# Patient Record
Sex: Female | Born: 2000 | Race: White | Hispanic: No | Smoking: Former smoker
Health system: Southern US, Community
[De-identification: ages and names within clinical notes are randomized; demographics above are authoritative.]

## PROBLEM LIST (undated history)

## (undated) ENCOUNTER — Inpatient Hospital Stay: Payer: Self-pay

## (undated) ENCOUNTER — Inpatient Hospital Stay (HOSPITAL_COMMUNITY): Payer: Self-pay

## (undated) DIAGNOSIS — N8000 Endometriosis of the uterus, unspecified: Secondary | ICD-10-CM

## (undated) DIAGNOSIS — E669 Obesity, unspecified: Secondary | ICD-10-CM

## (undated) DIAGNOSIS — D649 Anemia, unspecified: Secondary | ICD-10-CM

## (undated) DIAGNOSIS — R0600 Dyspnea, unspecified: Secondary | ICD-10-CM

## (undated) DIAGNOSIS — G43909 Migraine, unspecified, not intractable, without status migrainosus: Secondary | ICD-10-CM

## (undated) DIAGNOSIS — M79661 Pain in right lower leg: Secondary | ICD-10-CM

## (undated) DIAGNOSIS — N939 Abnormal uterine and vaginal bleeding, unspecified: Secondary | ICD-10-CM

## (undated) DIAGNOSIS — N83209 Unspecified ovarian cyst, unspecified side: Secondary | ICD-10-CM

## (undated) DIAGNOSIS — R519 Headache, unspecified: Secondary | ICD-10-CM

## (undated) DIAGNOSIS — Z8659 Personal history of other mental and behavioral disorders: Secondary | ICD-10-CM

## (undated) DIAGNOSIS — R51 Headache: Secondary | ICD-10-CM

## (undated) DIAGNOSIS — F32A Depression, unspecified: Secondary | ICD-10-CM

## (undated) DIAGNOSIS — Z87898 Personal history of other specified conditions: Secondary | ICD-10-CM

## (undated) HISTORY — PX: TONSILLECTOMY: SUR1361

## (undated) HISTORY — DX: Pain in right lower leg: M79.661

## (undated) HISTORY — DX: Obesity, unspecified: E66.9

## (undated) HISTORY — DX: Abnormal uterine and vaginal bleeding, unspecified: N93.9

## (undated) HISTORY — DX: Depression, unspecified: F32.A

## (undated) HISTORY — DX: Anemia, unspecified: D64.9

## (undated) HISTORY — DX: Personal history of other specified conditions: Z87.898

## (undated) HISTORY — DX: Personal history of other mental and behavioral disorders: Z86.59

## (undated) HISTORY — PX: WISDOM TOOTH EXTRACTION: SHX21

## (undated) HISTORY — DX: Dyspnea, unspecified: R06.00

## (undated) HISTORY — DX: Migraine, unspecified, not intractable, without status migrainosus: G43.909

---
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
## 2018-01-25 IMAGING — US US MFM OB DETAIL+14 WK
1 series · 12 of 28 positions shown · non-contrast
Comparison: none

PATIENT INFO:

PERFORMED BY:
ADURSH
SERVICE(S) PROVIDED:
INDICATIONS:
19 weeks gestation of pregnancy
Substance abuse
FETAL EVALUATION:
Num Of Fetuses:     1
Fetal Heart         135
Rate(bpm):
Cardiac Activity:   Present
Presentation:       Vertex
Placenta:           Anterior
P. Cord Insertion:  Normal
Largest Pocket(cm)
3.9
BIOMETRY:
BPD:      45.5  mm     G. Age:  19w 5d         81  %    CI:         75.3   %    70 - 86
FL/HC:      20.1   %    16.1 -
HC:      166.3  mm     G. Age:  19w 2d         58  %    HC/AC:      1.18        1.09 -
AC:      140.9  mm     G. Age:  19w 3d         61  %    FL/BPD:     73.6   %
FL:       33.5  mm     G. Age:  20w 4d         89  %    FL/AC:      23.8   %    20 - 24
HUM:      28.3  mm     G. Age:  19w 1d         53  %
CER:      19.7  mm     G. Age:  18w 6d         46  %
NFT:       3.6  mm
CM:        3.6  mm
Est. FW:     319  gm    0 lb 11 oz      90  %
GESTATIONAL AGE:
LMP:           23w 0d        Date:  05/06/16                 EDD:   02/10/17
U/S Today:     19w 5d                                        EDD:   03/05/17
Best:          19w 0d     Det. By:  U/S C R L  (08/05/16)    EDD:   03/10/17
ANATOMY:
Cranium:               Within Normal Limits   Aortic Arch:            Normal appearance
Cavum:                 CSP visualized         Ductal Arch:            Not visualized
Ventricles:            Normal appearance      Diaphragm:              Within Normal Limits
Choroid Plexus:        Within Normal Limits   Stomach:                Seen
Cerebellum:            Within Normal Limits   Abdomen:                Within Normal
Limits
Posterior Fossa:       Within Normal Limits   Abdominal Wall:         Normal appearance
Nuchal Fold:           Within Normal Limits   Cord Vessels:           3 vessels
Face:                  Orbits visualized      Kidneys:                Normal appearance
Lips:                  Normal appearance      Bladder:                Seen
Thoracic:              Within Normal Limits   Spine:                  Normal appearance
Heart:                 Normal 4 chamber       Upper Extremities:      Visualized
view
RVOT:                  Suboptimal             Lower Extremities:      Visualized
LVOT:                  Subopimal
Other:  Ao/pa not seen.
CERVIX UTERUS ADNEXA:
Cervix
Length:           4.45  cm.

[Series 1: us mfm ob detail+14 wk · 0.17mm/px · 12 of 84 slices shown]
[im 4/84]
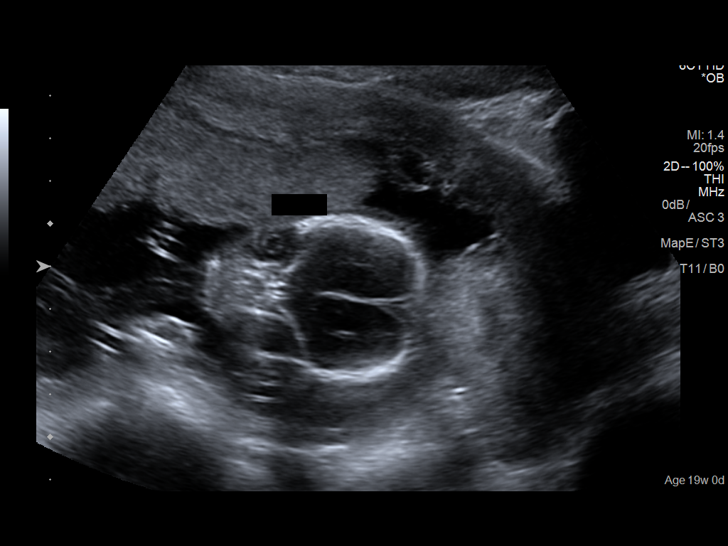
[im 10/84]
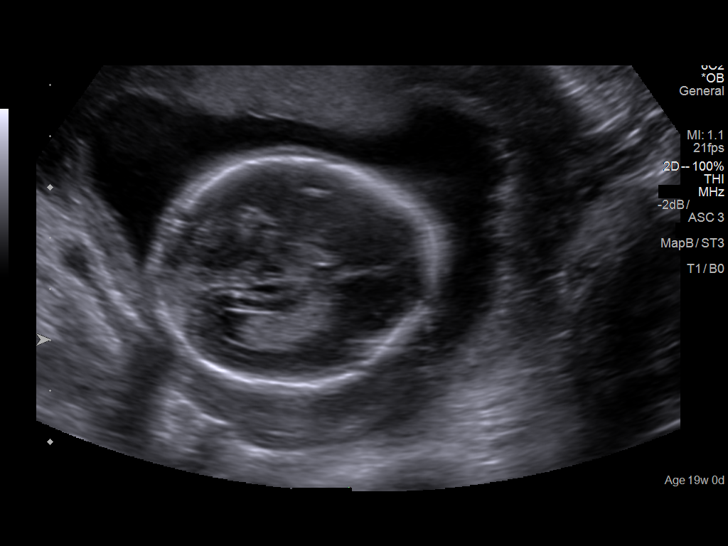
[im 16/84]
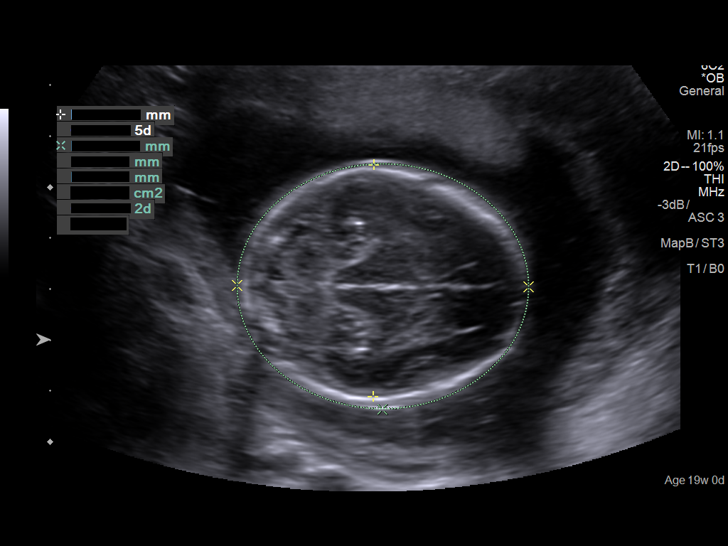
[im 25/84]
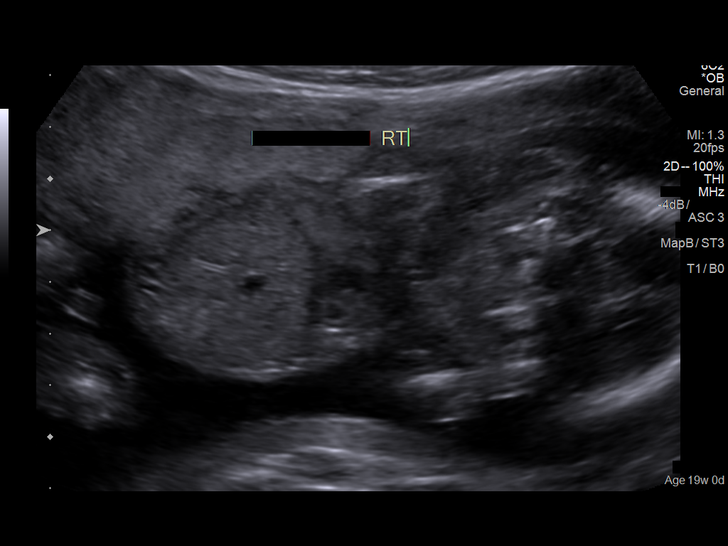
[im 31/84]
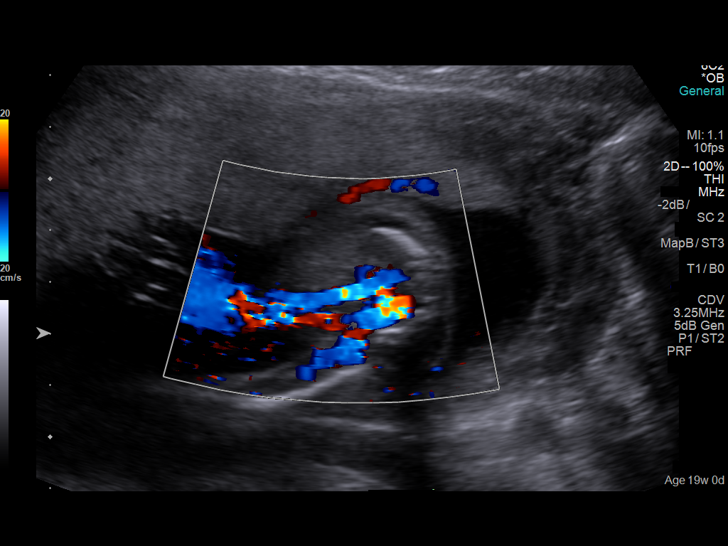
[im 37/84]
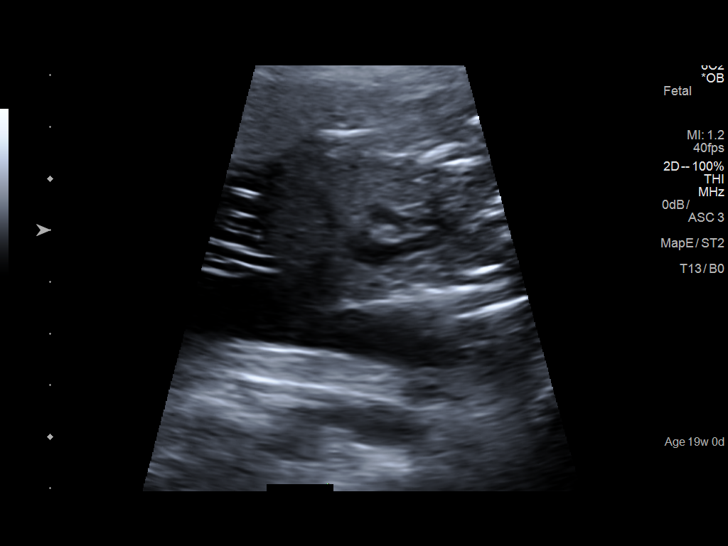
[im 47/84]
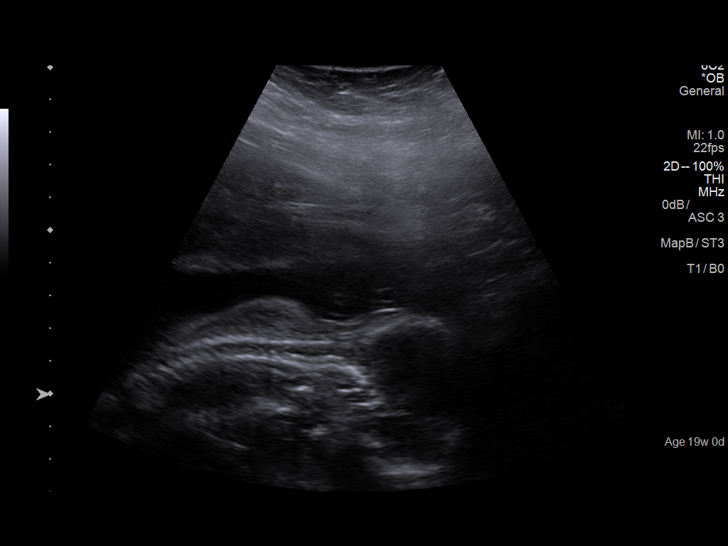
[im 53/84]
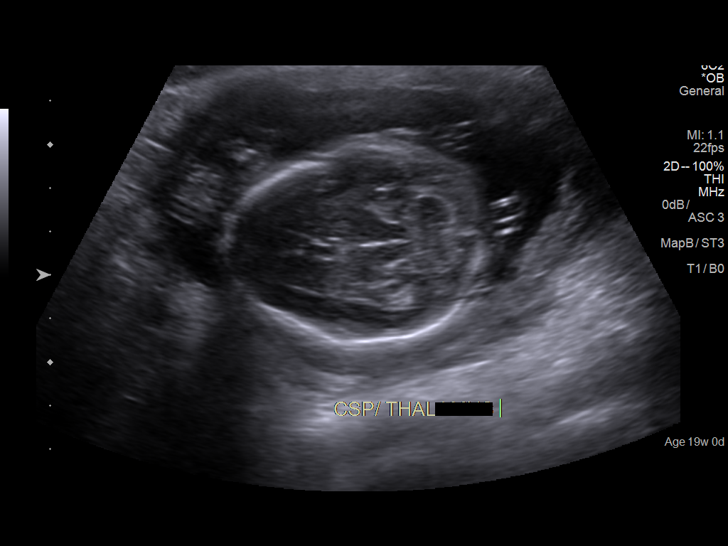
[im 59/84]
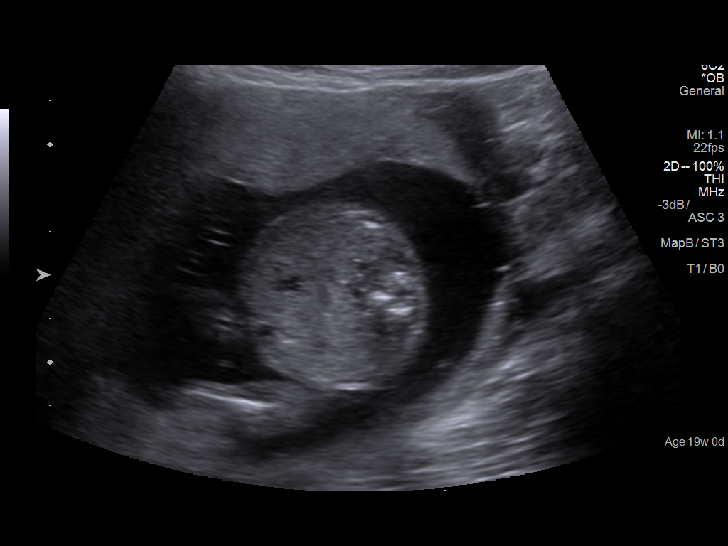
[im 68/84]
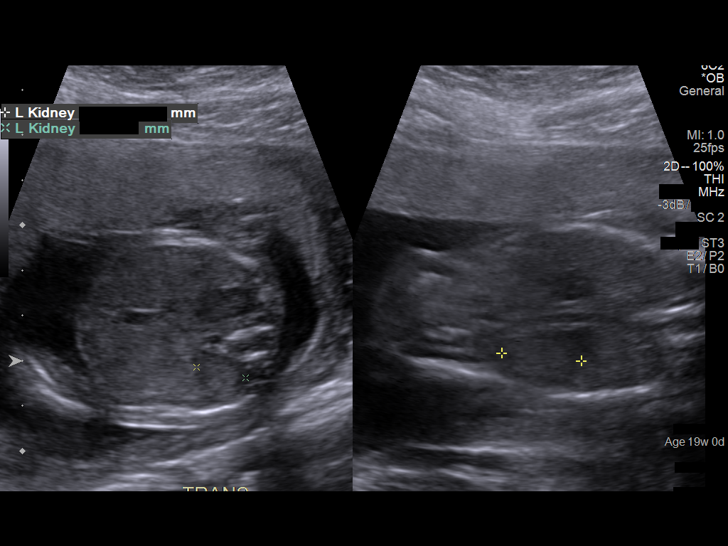
[im 74/84]
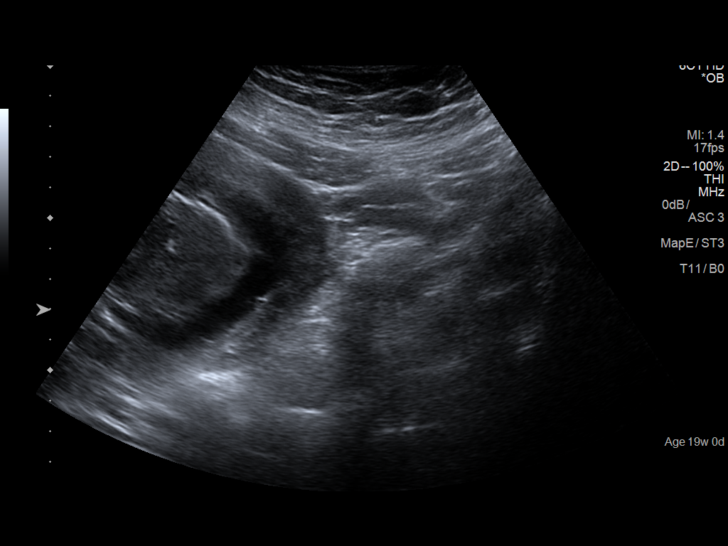
[im 80/84]
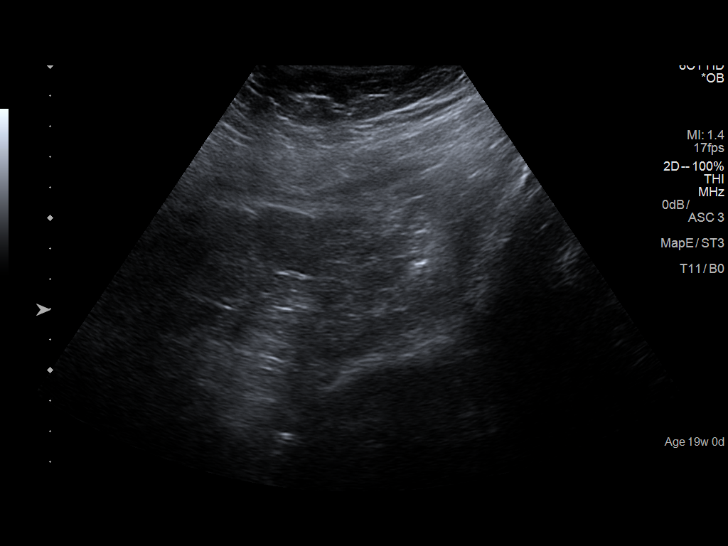

[12 of 28 positions shown; findings below may reference images not displayed]

IMPRESSION: Dear Ms.  ADURSH,

Thank you for referring your patient to [facility] for a
fetal anatomical survey.

There is a singleton gestation at 87w8d gestation by 9w0d
US performed here at [facility].

The fetal biometry correlates with established dating.

Detailed evaluation of the fetal anatomy was performed.
With the exception of the fetal heart views, which were
suboptimally visualized, the fetal anatomical survey appears
within normal limits.

An additional scan was scheduled in 4 weeks to complete the
anatomical survey.

Thank you for allowing us to participate in your patient's care.

assistance.

## 2018-04-20 DIAGNOSIS — Z6841 Body Mass Index (BMI) 40.0 and over, adult: Principal | ICD-10-CM

## 2018-09-25 ENCOUNTER — Ambulatory Visit: Payer: Medicaid Other

## 2018-10-02 ENCOUNTER — Ambulatory Visit (LOCAL_COMMUNITY_HEALTH_CENTER): Payer: Self-pay

## 2018-10-02 ENCOUNTER — Other Ambulatory Visit: Payer: Self-pay

## 2018-10-02 VITALS — BP 109/73 | Ht 66.0 in | Wt 274.5 lb

## 2018-10-02 DIAGNOSIS — Z30013 Encounter for initial prescription of injectable contraceptive: Secondary | ICD-10-CM

## 2018-10-02 DIAGNOSIS — Z3009 Encounter for other general counseling and advice on contraception: Secondary | ICD-10-CM

## 2018-10-02 MED ORDER — MEDROXYPROGESTERONE ACETATE 150 MG/ML IM SUSP
150.0000 mg | Freq: Once | INTRAMUSCULAR | Status: AC
  Administered 2018-10-02: 150 mg via INTRAMUSCULAR

## 2018-10-02 NOTE — Progress Notes (Signed)
Last physical with [facility] on 11/25/2017. Last depo at [facility] 07/17/2018; 11.0 weeks post depo today. DMPA 150 mg IM per Donnal Moat, CNM order dated 11/25/2017.

## 2018-12-21 ENCOUNTER — Other Ambulatory Visit: Payer: Self-pay

## 2018-12-21 ENCOUNTER — Ambulatory Visit: Payer: Self-pay

## 2018-12-21 DIAGNOSIS — Z20822 Contact with and (suspected) exposure to covid-19: Secondary | ICD-10-CM

## 2018-12-22 LAB — NOVEL CORONAVIRUS, NAA: SARS-CoV-2, NAA: NOT DETECTED

## 2018-12-28 ENCOUNTER — Telehealth: Payer: Self-pay | Admitting: Family Medicine

## 2018-12-28 NOTE — Telephone Encounter (Signed)
Wants to switch birth control from depo to nexplanon

## 2018-12-29 NOTE — Telephone Encounter (Signed)
TC from patient.  Discussed Nexplanon and Depo. Patient requests Nexplanon insertion appt; consult compeleted. Appt made. Aileen Fass, RN

## 2019-01-01 ENCOUNTER — Ambulatory Visit (LOCAL_COMMUNITY_HEALTH_CENTER): Payer: Self-pay | Admitting: Advanced Practice Midwife

## 2019-01-01 ENCOUNTER — Other Ambulatory Visit: Payer: Self-pay

## 2019-01-01 VITALS — BP 124/77 | Ht 66.0 in | Wt 264.0 lb

## 2019-01-01 DIAGNOSIS — Z3009 Encounter for other general counseling and advice on contraception: Secondary | ICD-10-CM

## 2019-01-01 DIAGNOSIS — Z30017 Encounter for initial prescription of implantable subdermal contraceptive: Secondary | ICD-10-CM

## 2019-01-01 MED ORDER — ETONOGESTREL 68 MG ~~LOC~~ IMPL
68.0000 mg | DRUG_IMPLANT | Freq: Once | SUBCUTANEOUS | Status: AC
  Administered 2019-01-01: 68 mg via SUBCUTANEOUS

## 2019-01-01 NOTE — Progress Notes (Signed)
Here today for Nexplanon Insertion. Switching from Depo to Nexplanon. Last Depo 10/02/2018 (13.0 weeks.) Hal Morales, RN

## 2019-01-01 NOTE — Progress Notes (Signed)
   [place] problem visit  [place] Department  Subjective:  Sandy Cox is a 18 y.o.WF nonsmoker G1P1 being seen today for Nexplanon insertion  Chief Complaint  Patient presents with  . Procedure    Nexplanon    HPI Last DMPA 10/02/18.  LMP 3 years ago (due to DMPA).  Last sex 12/28/18 without condom.  Last physical 11/2017.  Does the patient have a current or past history of drug use? No   No components found for: HCV]   Health Maintenance Due  Topic Date Due  . CHLAMYDIA SCREENING  09/20/2013    ROS  The following portions of the patient's history were reviewed and updated as appropriate: allergies, current medications, past family history, past medical history, past social history, past surgical history and problem list. Problem list updated.   See flowsheet for other program required questions.  Objective:   Vitals:   01/01/19 1600  BP: 124/77  Weight: 264 lb (119.7 kg)  Height: 5\' 6"  (1.676 m)    Physical Exam  n/a  Assessment and Plan:  Sandy Cox is a 18 y.o. female presenting to the [facility] Department for a Women's Health problem visit  1. Family planning Pt counseled on abstinance/back up condoms next 7 days  2. Encounter for initial prescription of implantable subdermal contraceptive Nexplanon Insertion Procedure Patient identified, informed consent performed, consent signed.   Patient does understand that irregular bleeding is a very common side effect of this medication. She was advised to have backup contraception after placement. Patient was determined to meet WHO criteria for not being pregnant. Appropriate time out taken.  The insertion site was identified 8-10 cm (3-4 inches) from the medial epicondyle of the humerus and 3-5 cm (1.25-2 inches) posterior to (below) the sulcus (groove) between the biceps and triceps muscles of the patient's left arm and marked.  Patient was prepped with  alcohol swab and then injected with 3 ml of 1% lidocaine.  Arm was prepped with chlorhexidene, Nexplanon removed from packaging,  Device confirmed in needle, then inserted full length of needle and withdrawn per handbook instructions. Nexplanon was able to palpated in the patient's arm; patient palpated the insert herself. There was minimal blood loss.  Patient insertion site covered with guaze and a pressure bandage to reduce any bruising.  The patient tolerated the procedure well and was given post procedure instructions.  Nexplanon:   Counseled patient to take OTC analgesic starting as soon as lidocaine starts to wear off and take regularly for at least 48 hr to decrease discomfort.  Specifically to take with food or milk to decrease stomach upset and for IB 600 mg (3 tablets) every 6 hrs; IB 800 mg (4 tablets) every 8 hrs; or Aleve 2 tablets every 12 hrs.   - etonogestrel (NEXPLANON) implant 68 mg     Return if symptoms worsen or fail to improve.  No future appointments.  Herbie Saxon, CNM

## 2019-01-02 ENCOUNTER — Telehealth: Payer: Self-pay

## 2019-01-02 NOTE — Telephone Encounter (Signed)
TC from patient who states she had nexplanon insertion yesterday and has a rash around the steri strips. Patient states the nexplanon insertion site is fine and doesn't hurt, but that she has a red, irritating rash around the bandage strips. She also states she has had a rash in the past when she was in the hospital --had a reaction to the tape used. Per consult with Dr. Ernestina Patches, patient advised to remove the bandage strips, take some Benadryl and put a band-aid over the insertion site. Patient agrees to plan.Jenetta Downer, RN

## 2019-01-03 NOTE — Telephone Encounter (Signed)
Documentation reflects my consult and recommendations.

## 2019-02-13 ENCOUNTER — Ambulatory Visit: Payer: HRSA Program | Attending: Internal Medicine

## 2019-02-13 DIAGNOSIS — Z20828 Contact with and (suspected) exposure to other viral communicable diseases: Secondary | ICD-10-CM | POA: Diagnosis present

## 2019-02-13 DIAGNOSIS — Z20822 Contact with and (suspected) exposure to covid-19: Secondary | ICD-10-CM

## 2019-02-14 ENCOUNTER — Encounter: Payer: Self-pay | Admitting: Advanced Practice Midwife

## 2019-02-14 LAB — NOVEL CORONAVIRUS, NAA: SARS-CoV-2, NAA: NOT DETECTED

## 2019-02-19 ENCOUNTER — Other Ambulatory Visit: Payer: Self-pay

## 2019-05-25 ENCOUNTER — Ambulatory Visit: Payer: Self-pay

## 2019-07-03 ENCOUNTER — Telehealth: Payer: Self-pay | Admitting: Family Medicine

## 2019-07-03 NOTE — Telephone Encounter (Signed)
Consulted by RN re:  patient symptoms.  Reviewed RN note and agree with advice as documented.

## 2019-07-03 NOTE — Telephone Encounter (Signed)
Returned patient phone call to provided number. Spoke with patient regarding phone message. Patient states she would like an appt to come in to have Nexplanon removed. Patient states she has "been bleeding ever since I got the Nexplanon and my chest and calf has hurt now for about a month." Patient denies SOB with chest pain but states she does have warmth, tenderness and pain in her calf. RN consulted with provider C. Hampton regarding above symptoms which advised patient needs to go to local ED now for evaluation of above symptoms. Patient agreeable to plan. RN scheduled appt for RP and Nexplanon removal for 07/05/2019 @ 1:20. Instructed patient to arrive at 12:45 for check in. Reviewed with patient to go to local ED now for evaluation of above symptoms. Patient continues to be agreeable to plan. Tawny Hopping, RN

## 2019-07-03 NOTE — Telephone Encounter (Signed)
Patient would like to get nexplanon removed due to excessive bleeding.  Would like to consult about other birth control.

## 2019-07-05 ENCOUNTER — Other Ambulatory Visit: Payer: Self-pay

## 2019-07-05 ENCOUNTER — Encounter: Payer: Self-pay | Admitting: Physician Assistant

## 2019-07-05 ENCOUNTER — Ambulatory Visit (LOCAL_COMMUNITY_HEALTH_CENTER): Payer: Self-pay | Admitting: Physician Assistant

## 2019-07-05 VITALS — BP 120/81 | Ht 66.0 in | Wt 239.8 lb

## 2019-07-05 DIAGNOSIS — Z3046 Encounter for surveillance of implantable subdermal contraceptive: Secondary | ICD-10-CM

## 2019-07-05 DIAGNOSIS — F319 Bipolar disorder, unspecified: Secondary | ICD-10-CM | POA: Insufficient documentation

## 2019-07-05 DIAGNOSIS — Z3009 Encounter for other general counseling and advice on contraception: Secondary | ICD-10-CM

## 2019-07-05 LAB — WET PREP FOR TRICH, YEAST, CLUE
Trichomonas Exam: NEGATIVE
Yeast Exam: NEGATIVE

## 2019-07-05 MED ORDER — MULTI-VITAMIN/MINERALS PO TABS: 1.0000 | ORAL_TABLET | Freq: Every day | ORAL | 0 refills | Status: DC

## 2019-07-05 NOTE — Progress Notes (Signed)
Reports prior IUD became "sideways in my cervix" (unable to document this on [facility]). Folic acid counseling completed and MVI dispensed. Jossie Ng, RN  Wet mount reviewed - results negative. Jossie Ng, RN

## 2019-07-05 NOTE — Progress Notes (Signed)
Family Planning Visit- Initial Visit  Subjective:  Sandy Cox is a 19 y.o.  G1P1001   being seen today for an initial well woman visit and to discuss family planning options.  She is currently using Nexplanon for pregnancy prevention. Patient reports she does not want a pregnancy in the next year.  Patient has the following medical conditions has Morbid obesity with BMI of 45.0-49.9, adult (HCC); 264 lbs; Migraines; and Bipolar 1 disorder (HCC) on their problem list.  Chief Complaint  Patient presents with  . Annual Exam    Patient reports hx migraine headache since age 58, now having daily headaches, prior dx of biplolar depression. Reports pelvic pain, hip pain, lower abdominal pain and  having daily vaginal bleeding, sometimes light, often brownish, occ heavy. Reports she had all these sx with DMPA, and sx have continued with Nexplanon. Wants to have Nexplanon removed and to discuss other contraceptive options. 1 sexual partner for last 6 years. See Routine Health Survey.  Patient denies N/V/F.   Body mass index is 38.7 kg/m. - Patient is eligible for diabetes screening based on BMI and age >49?  no HA1C ordered? not applicable  Patient reports 1 of partners in last year. Desires STI screening?  Yes  Has patient been screened once for HCV in the past?  No  No results found for: HCVAB  Does the patient have current of drug use, have a partner with drug use, and/or has been incarcerated since last result? yes  If yes-- Screen for HCV through [facility]   Does the patient meet criteria for HBV testing? No  Criteria:  -Household, sexual or needle sharing contact with HBV -History of drug use -HIV positive -Those with known Hep C   Health Maintenance Due  Topic Date Due  . CHLAMYDIA SCREENING  Never done  . COVID-19 Vaccine (1) Never done    ROS see Routine Health Survey  The following portions of the patient's history were reviewed and updated as appropriate:  allergies, current medications, past family history, past medical history, past social history, past surgical history and problem list. Problem list updated.   See flowsheet for other program required questions.  Objective:   Vitals:   07/05/19 1302  BP: 120/81  Weight: 239 lb 12.8 oz (108.8 kg)  Height: 5\' 6"  (1.676 m)    Physical Exam Constitutional:      General: She is not in acute distress.    Appearance: She is obese. She is not ill-appearing.  HENT:     Head: Normocephalic and atraumatic.     Mouth/Throat:     Mouth: Mucous membranes are moist.     Pharynx: Oropharynx is clear.  Eyes:     Extraocular Movements: Extraocular movements intact.  Cardiovascular:     Rate and Rhythm: Normal rate and regular rhythm.     Pulses: Normal pulses.     Heart sounds: Normal heart sounds.  Pulmonary:     Effort: Pulmonary effort is normal.     Breath sounds: Normal breath sounds.  Abdominal:     General: Bowel sounds are normal.     Palpations: Abdomen is soft.     Tenderness: There is no abdominal tenderness. There is no right CVA tenderness, left CVA tenderness, guarding or rebound.  Genitourinary:    General: Normal vulva.     Pubic Area: No rash.      Labia:        Right: No rash, tenderness or  lesion.        Left: No rash, tenderness or lesion.      Urethra: No urethral lesion.     Vagina: No foreign body. Vaginal discharge present. No tenderness or lesions.     Cervix: No cervical motion tenderness or lesion.     Uterus: Normal. Not tender.      Comments: R labia majora more prominent than L labia majora; sm amt dark brown vag discharge c/w resolving menses. Musculoskeletal:        General: Normal range of motion.     Cervical back: Normal range of motion and neck supple.  Lymphadenopathy:     Cervical: No cervical adenopathy.     Lower Body: No right inguinal adenopathy. No left inguinal adenopathy.  Skin:    General: Skin is warm.     Findings: No rash.      Comments: 4cm NT rod palpable L bicipital groove  Neurological:     Mental Status: She is alert and oriented to person, place, and time.  Psychiatric:        Behavior: Behavior normal.        Thought Content: Thought content normal.        Judgment: Judgment normal.   Nexplanon Removal Patient identified, informed consent performed, consent signed.   Appropriate time out taken. Nexplanon site identified.  Area prepped in usual sterile fashon. 3 ml of 1% lidocaine with Epinephrine was used to anesthetize the area at the distal end of the implant and along implant site. A small stab incision was made right beside the implant on the distal portion.  The Nexplanon rod was grasped using hemostats and removed without difficulty.  There was minimal blood loss. There were no complications.  Steri-strips were applied over the small incision.  A pressure bandage was applied to reduce any bruising.  The patient tolerated the procedure well and was given post procedure instructions.    Assessment and Plan:  Sandy Cox is a 19 y.o. female presenting to the [facility] Department for an initial well woman exam/family planning visit  Contraception counseling: Reviewed all forms of birth control options in the tiered based approach. available including abstinence; over the counter/barrier methods; hormonal contraceptive medication including pill, patch, ring, injection,contraceptive implant, ECP; hormonal and nonhormonal IUDs; permanent sterilization options including vasectomy and the various tubal sterilization modalities. Risks, benefits, and typical effectiveness rates were reviewed.  Questions were answered.  Written information was also given to the patient to review.  Patient desires Nexplanon removal and to start diaphragm. She will follow up in  1 week for diaphragm fitting.  She was told to call with any further questions, or with any concerns about this method of contraception.   Emphasized use of condoms 100% of the time for STI prevention.  Patient was not offered ECP.  1. Nexplanon removal Completed at pt request, no complications  2. Family planning services Extensive discussion of BCM. Want to avoid hormonal methods in light of possible migraine headaches. Pt desires diaphragm, with condoms while awaiting fitting in 1 week. Strongly enc to est PCP in light of multiple concerns on Routine Health Survey. - WET PREP FOR TRICH, YEAST, CLUE - Chlamydia/Gonorrhea [facility] - Multiple Vitamins-Minerals (MULTIVITAMIN WITH MINERALS) tablet; Take 1 tablet by mouth daily.  Dispense: 100 tablet; Refill: 0  3. Bipolar 1 disorder (HCC) Offered LCSW referral, done. - Ambulatory referral to Behavioral Health  Return in about 1 week (around 07/12/2019) for FP  Prob/diaphragm fitting with Criss Rosales PA.  No future appointments.  Lora Havens, PA-C

## 2019-07-09 ENCOUNTER — Encounter: Payer: Self-pay | Admitting: Advanced Practice Midwife

## 2019-08-01 ENCOUNTER — Ambulatory Visit: Payer: Self-pay | Admitting: Licensed Clinical Social Worker

## 2019-08-15 ENCOUNTER — Ambulatory Visit: Payer: Self-pay | Admitting: Licensed Clinical Social Worker

## 2019-11-01 ENCOUNTER — Other Ambulatory Visit: Payer: Self-pay

## 2019-11-01 DIAGNOSIS — Z20822 Contact with and (suspected) exposure to covid-19: Secondary | ICD-10-CM

## 2019-11-03 LAB — NOVEL CORONAVIRUS, NAA: SARS-CoV-2, NAA: NOT DETECTED

## 2019-11-03 LAB — SARS-COV-2, NAA 2 DAY TAT

## 2019-12-10 ENCOUNTER — Other Ambulatory Visit: Payer: Self-pay

## 2019-12-10 ENCOUNTER — Emergency Department (HOSPITAL_COMMUNITY)
Admission: EM | Admit: 2019-12-10 | Discharge: 2019-12-10 | Disposition: A | Discharge status: Home / Self Care | Payer: Medicaid Other | Attending: Emergency Medicine | Admitting: Emergency Medicine

## 2019-12-10 ENCOUNTER — Encounter (HOSPITAL_COMMUNITY): Payer: Self-pay | Admitting: Emergency Medicine

## 2019-12-10 DIAGNOSIS — Z3A01 Less than 8 weeks gestation of pregnancy: Secondary | ICD-10-CM | POA: Insufficient documentation

## 2019-12-10 DIAGNOSIS — O209 Hemorrhage in early pregnancy, unspecified: Secondary | ICD-10-CM | POA: Diagnosis not present

## 2019-12-10 DIAGNOSIS — Z87891 Personal history of nicotine dependence: Secondary | ICD-10-CM | POA: Diagnosis not present

## 2019-12-10 DIAGNOSIS — N938 Other specified abnormal uterine and vaginal bleeding: Secondary | ICD-10-CM

## 2019-12-10 DIAGNOSIS — O26891 Other specified pregnancy related conditions, first trimester: Secondary | ICD-10-CM | POA: Diagnosis present

## 2019-12-10 LAB — BASIC METABOLIC PANEL
Anion gap: 6 (ref 5–15)
BUN: 16 mg/dL (ref 6–20)
CO2: 22 mmol/L (ref 22–32)
Calcium: 9.3 mg/dL (ref 8.9–10.3)
Chloride: 110 mmol/L (ref 98–111)
Creatinine, Ser: 0.69 mg/dL (ref 0.44–1.00)
GFR, Estimated: >60 mL/min (ref >60)
Glucose, Bld: 100 mg/dL — ABNORMAL HIGH (ref 70–99)
Potassium: 4.3 mmol/L (ref 3.5–5.1)
Sodium: 138 mmol/L (ref 135–145)

## 2019-12-10 LAB — URINALYSIS, ROUTINE W REFLEX MICROSCOPIC
Bilirubin Urine: NEGATIVE
Glucose, UA: NEGATIVE mg/dL
Ketones, ur: NEGATIVE mg/dL
Leukocytes,Ua: NEGATIVE
Nitrite: NEGATIVE
Protein, ur: 100 mg/dL — AB
RBC / HPF: >50 RBC/hpf — ABNORMAL HIGH (ref 0–5)
Specific Gravity, Urine: 1.021 (ref 1.005–1.030)
pH: 6 (ref 5.0–8.0)

## 2019-12-10 LAB — CBC WITH DIFFERENTIAL/PLATELET
Abs Immature Granulocytes: 0.02 10*3/uL (ref 0.00–0.07)
Basophils Absolute: 0.1 10*3/uL (ref 0.0–0.1)
Basophils Relative: 1 %
Eosinophils Absolute: 0.2 10*3/uL (ref 0.0–0.5)
Eosinophils Relative: 3 %
HCT: 41.5 % (ref 36.0–46.0)
Hemoglobin: 13.8 g/dL (ref 12.0–15.0)
Immature Granulocytes: 0 %
Lymphocytes Relative: 38 %
Lymphs Abs: 2.7 10*3/uL (ref 0.7–4.0)
MCH: 30.1 pg (ref 26.0–34.0)
MCHC: 33.3 g/dL (ref 30.0–36.0)
MCV: 90.6 fL (ref 80.0–100.0)
Monocytes Absolute: 0.7 10*3/uL (ref 0.1–1.0)
Monocytes Relative: 10 %
Neutro Abs: 3.4 10*3/uL (ref 1.7–7.7)
Neutrophils Relative %: 48 %
Platelets: 213 10*3/uL (ref 150–400)
RBC: 4.58 MIL/uL (ref 3.87–5.11)
RDW: 12.1 % (ref 11.5–15.5)
WBC: 7.1 10*3/uL (ref 4.0–10.5)
nRBC: 0 % (ref 0.0–0.2)

## 2019-12-10 LAB — WET PREP, GENITAL
Clue Cells Wet Prep HPF POC: NONE SEEN
Sperm: NONE SEEN
Trich, Wet Prep: NONE SEEN
Yeast Wet Prep HPF POC: NONE SEEN

## 2019-12-10 LAB — HCG, QUANTITATIVE, PREGNANCY: hCG, Beta Chain, Quant, S: 6 m[IU]/mL — ABNORMAL HIGH (ref <5)

## 2019-12-10 LAB — ABO/RH: ABO/RH(D): A NEG

## 2019-12-10 LAB — POC URINE PREG, ED: Preg Test, Ur: NEGATIVE

## 2019-12-10 NOTE — ED Provider Notes (Signed)
[facility] EMERGENCY DEPARTMENT Provider Note   CSN: [id number] Arrival date & time: 12/10/19  1117     History Chief Complaint  Patient presents with  . Vaginal Bleeding    Sandy Cox is a 19 y.o. female  G2P1 with a history as outlined below who had her nexplanon removed in May 2021 with hope of becoming pregnant.  She took 6 home pregnancy tests, all positive after developing mild nausea and breast tenderness and realizing she missed her period, stating last LMP was either early August or Sept, cannot recall.  However, suspects she is currently about [redacted] weeks pregnant.  She developed vaginal spotting 2 days ago, now a little brisker but not a full period which she states is always heavy.  She denies pelvic pain, no passage of clots, no weakness or dizziness.  She did have one episode of vomiting today.  She has not yet established prenatal care. She denies history of vaginal spotting, also no dysuria, no abd or back pain.     HPI     Past Medical History:  Diagnosis Date  . Headache    sinus  . History of bipolar disorder   . History of depression   . History of dizziness   . History of nausea   . History of shortness of breath   . History of weight loss   . Ovarian cyst   . Right calf pain   . Uterine endometriosis    also cysts/ulcers per mother  . Vaginal bleeding     Patient Active Problem List   Diagnosis Date Noted  . Bipolar 1 disorder (HCC) 07/05/2019  . Morbid obesity with BMI of 45.0-49.9, adult (HCC); 264 lbs 11/25/2017  . Migraines 11/25/2017    Past Surgical History:  Procedure Laterality Date  . TONSILLECTOMY     T & A 2017  . TONSILLECTOMY AND ADENOIDECTOMY N/A 02/12/2015   Procedure: TONSILLECTOMY AND ADENOIDECTOMY;  Surgeon: Bud Face, MD;  Location: [facility];  Service: ENT;  Laterality: N/A;  . WISDOM TOOTH EXTRACTION       OB History    Gravida  2   Para  1   Term  1   Preterm      AB      Living  1      SAB      TAB      Ectopic      Multiple  0   Live Births  1           Family History  Problem Relation Age of Onset  . Cancer Maternal Grandmother   . Migraines Maternal Grandmother   . Depression Maternal Grandmother   . Cancer Maternal Grandfather   . Hypertension Maternal Grandfather   . Heart disease Maternal Grandfather   . Migraines Maternal Grandfather   . Migraines Mother   . Bipolar disorder Mother   . Migraines Brother   . Bipolar disorder Brother   . Migraines Sister   . Bipolar disorder Sister     Social History   Tobacco Use  . Smoking status: Former Smoker    Packs/day: 0.50  . Smokeless tobacco: Never Used  . Tobacco comment: Quit smoking in past year.  Vaping Use  . Vaping Use: Never used  Substance Use Topics  . Alcohol use: Yes  . Drug use: Yes    Types: Marijuana    Comment: heroine xanax oxycodone/ denies as of 01/13/17    Home Medications  Prior to Admission medications   Medication Sig Start Date End Date Taking? Authorizing Provider  cyclobenzaprine (FLEXERIL) 10 MG tablet Take 10 mg by mouth 3 (three) times daily as needed for muscle spasms.    [provider]  ferrous sulfate 325 (65 FE) MG EC tablet Take 325 mg by mouth 3 (three) times daily with meals.    [provider]  ferrous sulfate 325 (65 FE) MG tablet Take 1 tablet (325 mg total) by mouth daily with breakfast. Take with Vitamin C 03/08/17 05/07/17  Christeen Douglas, MD  ibuprofen (ADVIL,MOTRIN) 800 MG tablet Take 1 tablet (800 mg total) by mouth every 8 (eight) hours as needed for moderate pain or cramping. Patient not taking: Reported on 10/02/2018 03/08/17   Christeen Douglas, MD  Multiple Vitamins-Minerals (MULTIVITAMIN WITH MINERALS) tablet Take 1 tablet by mouth daily. 07/05/19   Federico Flake, MD  Prenatal Vit-Fe Fumarate-FA (PRENATAL MULTIVITAMIN) TABS tablet Take 1 tablet by mouth daily at 12 noon.    [provider]    Allergies     Patient has no known allergies.  Review of Systems   Review of Systems  Constitutional: Negative.   HENT: Negative.   Eyes: Negative.   Respiratory: Negative for shortness of breath.   Cardiovascular: Negative for chest pain.  Gastrointestinal: Positive for nausea and vomiting. Negative for abdominal pain.       Negative except as mentioned in HPI.   Genitourinary: Positive for vaginal bleeding. Negative for dysuria, flank pain and pelvic pain.  Musculoskeletal: Negative for arthralgias, back pain, joint swelling and neck pain.  Skin: Negative.  Negative for rash and wound.  Neurological: Negative for dizziness, weakness, light-headedness, numbness and headaches.  Psychiatric/Behavioral: Negative.   All other systems reviewed and are negative.   Physical Exam Updated Vital Signs BP 98/63 (BP Location: Left Arm)   Pulse 79   Temp 98.3 F (36.8 C)   Resp 14   Ht 5\' 6"  (1.676 m)   Wt 108.4 kg   LMP 07/03/2019 (Exact Date)   SpO2 100%   BMI 38.58 kg/m   Physical Exam Vitals and nursing note reviewed. Exam conducted with a chaperone present.  Constitutional:      Appearance: She is well-developed.  HENT:     Head: Normocephalic and atraumatic.  Eyes:     Conjunctiva/sclera: Conjunctivae normal.  Cardiovascular:     Rate and Rhythm: Normal rate and regular rhythm.     Heart sounds: Normal heart sounds.  Pulmonary:     Effort: Pulmonary effort is normal.     Breath sounds: Normal breath sounds. No wheezing.  Abdominal:     General: Bowel sounds are normal.     Palpations: Abdomen is soft.     Tenderness: There is no abdominal tenderness. There is no guarding.  Genitourinary:    Vagina: Normal.     Cervix: Normal.     Uterus: Normal.      Adnexa:        Right: No mass, tenderness or fullness.         Left: No mass, tenderness or fullness.       Comments: Os closed. Blood in vagina, no clots.  Musculoskeletal:        General: Normal range of motion.     Cervical  back: Normal range of motion.  Skin:    General: Skin is warm and dry.  Neurological:     Mental Status: She is alert.  ED Results / Procedures / Treatments   Labs (all labs ordered are listed, but only abnormal results are displayed) Labs Reviewed  WET PREP, GENITAL - Abnormal; Notable for the following components:      Result Value   WBC, Wet Prep HPF POC RARE (*)    All other components within normal limits  BASIC METABOLIC PANEL - Abnormal; Notable for the following components:   Glucose, Bld 100 (*)    All other components within normal limits  URINALYSIS, ROUTINE W REFLEX MICROSCOPIC - Abnormal; Notable for the following components:   Color, Urine AMBER (*)    APPearance CLOUDY (*)    Hgb urine dipstick LARGE (*)    Protein, ur 100 (*)    RBC / HPF >50 (*)    Bacteria, UA FEW (*)    All other components within normal limits  HCG, QUANTITATIVE, PREGNANCY - Abnormal; Notable for the following components:   hCG, Beta Chain, Quant, S 6 (*)    All other components within normal limits  CBC WITH DIFFERENTIAL/PLATELET  POC URINE PREG, ED  POC URINE PREG, ED  ABO/RH  GC/CHLAMYDIA PROBE AMP ([place]) NOT AT [facility]    EKG None  Radiology No results found.  Procedures Procedures (including critical care time)  Medications Ordered in ED Medications - No data to display  ED Course  I have reviewed the triage vital signs and the nursing notes.  Pertinent labs & imaging results that were available during my care of the patient were reviewed by me and considered in my medical decision making (see chart for details).    MDM Rules/Calculators/A&P                          Pt with signs and sx suggesting pregnancy with nausea and breast tenderness, 5 months out from removal of Nexplanon.  Her quant beta hcg is negative, other labs stable.  Suspect DUB,  Assurance given pt is not pregnant currently.  She was given referral to [facility] to establish gyn care.   Final  Clinical Impression(s) / ED Diagnoses Final diagnoses:  Dysfunctional uterine bleeding    Rx / DC Orders ED Discharge Orders    None       Victoriano Lain 12/10/19 Leitha Schuller, MD 12/11/19 [phone]

## 2019-12-10 NOTE — Discharge Instructions (Addendum)
As discussed, please establish care with [facility] for primary gynecology care but also for care as you are trying to get pregnant.

## 2019-12-10 NOTE — ED Triage Notes (Signed)
Pt states she has been having vaginal bleeding x 2 days. States she [redacted] weeks pregnant

## 2019-12-11 LAB — GC/CHLAMYDIA PROBE AMP (~~LOC~~) NOT AT ARMC
Chlamydia: NEGATIVE
Comment: NEGATIVE
Comment: NORMAL
Neisseria Gonorrhea: NEGATIVE

## 2019-12-18 ENCOUNTER — Other Ambulatory Visit: Payer: Self-pay

## 2019-12-18 DIAGNOSIS — Z3009 Encounter for other general counseling and advice on contraception: Secondary | ICD-10-CM

## 2019-12-24 MED ORDER — MULTI-VITAMIN/MINERALS PO TABS: 1.0000 | ORAL_TABLET | Freq: Every day | ORAL | 0 refills | Status: DC

## 2019-12-29 ENCOUNTER — Other Ambulatory Visit: Payer: Self-pay

## 2019-12-29 DIAGNOSIS — Z20822 Contact with and (suspected) exposure to covid-19: Secondary | ICD-10-CM

## 2019-12-30 LAB — NOVEL CORONAVIRUS, NAA: SARS-CoV-2, NAA: NOT DETECTED

## 2019-12-30 LAB — SARS-COV-2, NAA 2 DAY TAT

## 2020-01-11 ENCOUNTER — Encounter: Payer: Self-pay | Admitting: Physician Assistant

## 2020-01-14 ENCOUNTER — Other Ambulatory Visit: Payer: Self-pay

## 2020-01-14 ENCOUNTER — Ambulatory Visit (LOCAL_COMMUNITY_HEALTH_CENTER): Payer: Self-pay

## 2020-01-14 VITALS — BP 123/81 | Ht 66.0 in | Wt 232.0 lb

## 2020-01-14 DIAGNOSIS — Z3200 Encounter for pregnancy test, result unknown: Secondary | ICD-10-CM

## 2020-01-14 LAB — PREGNANCY, URINE: Preg Test, Ur: POSITIVE — AB

## 2020-01-14 MED ORDER — PRENATAL VITAMINS 28-0.8 MG PO TABS: 28.0000 mg | ORAL_TABLET | Freq: Every day | ORAL | 0 refills | Status: AC

## 2020-01-14 NOTE — Progress Notes (Signed)
Patient plans [facility] at [facility].  Richmond Campbell, RN

## 2020-01-29 ENCOUNTER — Emergency Department (HOSPITAL_COMMUNITY): Payer: Medicaid Other

## 2020-01-29 ENCOUNTER — Encounter (HOSPITAL_COMMUNITY): Payer: Self-pay | Admitting: *Deleted

## 2020-01-29 ENCOUNTER — Other Ambulatory Visit: Payer: Self-pay

## 2020-01-29 ENCOUNTER — Emergency Department (HOSPITAL_COMMUNITY)
Admission: EM | Admit: 2020-01-29 | Discharge: 2020-01-29 | Disposition: A | Discharge status: Home / Self Care | Payer: Medicaid Other | Attending: Emergency Medicine | Admitting: Emergency Medicine

## 2020-01-29 DIAGNOSIS — R1084 Generalized abdominal pain: Secondary | ICD-10-CM

## 2020-01-29 DIAGNOSIS — Z20822 Contact with and (suspected) exposure to covid-19: Secondary | ICD-10-CM | POA: Diagnosis not present

## 2020-01-29 DIAGNOSIS — R1011 Right upper quadrant pain: Secondary | ICD-10-CM

## 2020-01-29 DIAGNOSIS — R110 Nausea: Secondary | ICD-10-CM | POA: Diagnosis not present

## 2020-01-29 DIAGNOSIS — R079 Chest pain, unspecified: Secondary | ICD-10-CM

## 2020-01-29 DIAGNOSIS — R0789 Other chest pain: Secondary | ICD-10-CM | POA: Diagnosis not present

## 2020-01-29 DIAGNOSIS — Z79899 Other long term (current) drug therapy: Secondary | ICD-10-CM | POA: Insufficient documentation

## 2020-01-29 DIAGNOSIS — Z3A01 Less than 8 weeks gestation of pregnancy: Secondary | ICD-10-CM

## 2020-01-29 DIAGNOSIS — F17210 Nicotine dependence, cigarettes, uncomplicated: Secondary | ICD-10-CM | POA: Diagnosis not present

## 2020-01-29 LAB — URINALYSIS, ROUTINE W REFLEX MICROSCOPIC
Bilirubin Urine: NEGATIVE
Glucose, UA: NEGATIVE mg/dL
Hgb urine dipstick: NEGATIVE
Ketones, ur: NEGATIVE mg/dL
Leukocytes,Ua: NEGATIVE
Nitrite: NEGATIVE
Protein, ur: NEGATIVE mg/dL
Specific Gravity, Urine: 1.019 (ref 1.005–1.030)
pH: 7 (ref 5.0–8.0)

## 2020-01-29 LAB — COMPREHENSIVE METABOLIC PANEL
ALT: 26 U/L (ref 0–44)
AST: 23 U/L (ref 15–41)
Albumin: 3.8 g/dL (ref 3.5–5.0)
Alkaline Phosphatase: 38 U/L (ref 38–126)
Anion gap: 7 (ref 5–15)
BUN: 10 mg/dL (ref 6–20)
CO2: 23 mmol/L (ref 22–32)
Calcium: 8.8 mg/dL — ABNORMAL LOW (ref 8.9–10.3)
Chloride: 104 mmol/L (ref 98–111)
Creatinine, Ser: 0.53 mg/dL (ref 0.44–1.00)
GFR, Estimated: >60 mL/min (ref >60)
Glucose, Bld: 97 mg/dL (ref 70–99)
Potassium: 3.8 mmol/L (ref 3.5–5.1)
Sodium: 134 mmol/L — ABNORMAL LOW (ref 135–145)
Total Bilirubin: 0.6 mg/dL (ref 0.3–1.2)
Total Protein: 6.6 g/dL (ref 6.5–8.1)

## 2020-01-29 LAB — LIPASE, BLOOD: Lipase: 19 U/L (ref 11–51)

## 2020-01-29 LAB — PREGNANCY, URINE: Preg Test, Ur: POSITIVE — AB

## 2020-01-29 LAB — RESP PANEL BY RT-PCR (FLU A&B, COVID) ARPGX2
Influenza A by PCR: NEGATIVE
Influenza B by PCR: NEGATIVE
SARS Coronavirus 2 by RT PCR: NEGATIVE

## 2020-01-29 LAB — TROPONIN I (HIGH SENSITIVITY)
Troponin I (High Sensitivity): <2 ng/L (ref <18)
Troponin I (High Sensitivity): <2 ng/L (ref <18)

## 2020-01-29 LAB — CBC
HCT: 42.4 % (ref 36.0–46.0)
Hemoglobin: 14.1 g/dL (ref 12.0–15.0)
MCH: 29.6 pg (ref 26.0–34.0)
MCHC: 33.3 g/dL (ref 30.0–36.0)
MCV: 89.1 fL (ref 80.0–100.0)
Platelets: 228 10*3/uL (ref 150–400)
RBC: 4.76 MIL/uL (ref 3.87–5.11)
RDW: 12.2 % (ref 11.5–15.5)
WBC: 9 10*3/uL (ref 4.0–10.5)
nRBC: 0 % (ref 0.0–0.2)

## 2020-01-29 NOTE — Discharge Instructions (Addendum)
Follow up for prenatal care.  Return if any problems.

## 2020-01-29 NOTE — ED Triage Notes (Addendum)
Pt c/o right sided chest pain that radiated to the right shoulder blade, dizziness, nausea, upper abdominal pain that started last night. Pt reports diarrhea x 1 week. Pt is [redacted] weeks pregnant. Pt's pregnancy is being followed by [facility].

## 2020-01-29 NOTE — ED Provider Notes (Signed)
[facility] EMERGENCY DEPARTMENT Provider Note   CSN: [id number] Arrival date & time: 01/29/20  0844     History Chief Complaint  Patient presents with   Chest Pain   Nausea    Sandy Cox is a 19 y.o. female.  The history is provided by the patient.  Abdominal Pain Pain location:  RUQ and R flank Pain quality: aching   Pain radiates to:  Does not radiate Pain severity:  Moderate Onset quality:  Gradual Duration:  1 week Timing:  Constant Progression:  Worsening Chronicity:  New Context: not alcohol use and not sick contacts   Relieved by:  Nothing Worsened by:  Nothing Associated symptoms: no fever   Risk factors: pregnancy   Pt complains of right upper abdominal pain.  Pt reports she is early pregnant. Pt worried something is wrong with pregnancy.  Pt reports abdominal pain and cramping for 1 week.  Pt has had diarrhea    Past Medical History:  Diagnosis Date   Headache    sinus   History of bipolar disorder    History of depression    History of dizziness    History of nausea    History of shortness of breath    History of weight loss    Ovarian cyst    Right calf pain    Uterine endometriosis    also cysts/ulcers per mother   Vaginal bleeding     Patient Active Problem List   Diagnosis Date Noted   Bipolar 1 disorder (HCC) 07/05/2019   Morbid obesity with BMI of 45.0-49.9, adult (HCC); 264 lbs 11/25/2017   Migraines 11/25/2017    Past Surgical History:  Procedure Laterality Date   TONSILLECTOMY     T & A 2017   TONSILLECTOMY AND ADENOIDECTOMY N/A 02/12/2015   Procedure: TONSILLECTOMY AND ADENOIDECTOMY;  Surgeon: Bud Face, MD;  Location: [facility];  Service: ENT;  Laterality: N/A;   WISDOM TOOTH EXTRACTION       OB History    Gravida  2   Para  1   Term  1   Preterm      AB      Living  1     SAB      IAB      Ectopic      Multiple  0   Live Births  1           Family  History  Problem Relation Age of Onset   Cancer Maternal Grandmother    Migraines Maternal Grandmother    Depression Maternal Grandmother    Cancer Maternal Grandfather    Hypertension Maternal Grandfather    Heart disease Maternal Grandfather    Migraines Maternal Grandfather    Migraines Mother    Bipolar disorder Mother    Migraines Brother    Bipolar disorder Brother    Migraines Sister    Bipolar disorder Sister     Social History   Tobacco Use   Smoking status: Current Every Day Smoker    Packs/day: 0.25    Types: Cigarettes   Smokeless tobacco: Never Used  Vaping Use   Vaping Use: Never used  Substance Use Topics   Alcohol use: Not Currently   Drug use: Not Currently    Types: Marijuana    Comment: heroine xanax oxycodone/ denies as of 01/13/17    Home Medications Prior to Admission medications   Medication Sig Start Date End Date Taking? Authorizing Provider  ferrous sulfate  325 (65 FE) MG EC tablet Take 325 mg by mouth 3 (three) times daily with meals.   Yes [provider]  Prenatal Vit-Fe Fumarate-FA (PRENATAL VITAMINS) 28-0.8 MG TABS Take 28 mg by mouth daily. 01/14/20 04/13/20 Yes Federico Flake, MD    Allergies    Patient has no known allergies.  Review of Systems   Review of Systems  Constitutional: Negative for fever.  Gastrointestinal: Positive for abdominal pain.  All other systems reviewed and are negative.   Physical Exam Updated Vital Signs BP 124/73 (BP Location: Left Arm)    Pulse 61    Temp 98.6 F (37 C) (Oral)    Resp 16    Ht 5\' 5"  (1.651 m)    Wt 106.1 kg    LMP 11/15/2019 (Approximate)    SpO2 100%    BMI 38.94 kg/m   Physical Exam Vitals and nursing note reviewed.  Constitutional:      Appearance: She is well-developed and well-nourished.  HENT:     Head: Normocephalic.  Eyes:     Extraocular Movements: EOM normal.  Cardiovascular:     Rate and Rhythm: Normal rate and regular rhythm.      Heart sounds: Normal heart sounds.  Pulmonary:     Effort: Pulmonary effort is normal.     Breath sounds: Normal breath sounds.  Abdominal:     General: There is no distension.     Palpations: Abdomen is soft.  Musculoskeletal:        General: Normal range of motion.     Cervical back: Normal range of motion.  Skin:    General: Skin is warm.  Neurological:     General: No focal deficit present.     Mental Status: She is alert and oriented to person, place, and time.  Psychiatric:        Mood and Affect: Mood and affect and mood normal.     ED Results / Procedures / Treatments   Labs (all labs ordered are listed, but only abnormal results are displayed) Labs Reviewed  COMPREHENSIVE METABOLIC PANEL - Abnormal; Notable for the following components:      Result Value   Sodium 134 (*)    Calcium 8.8 (*)    All other components within normal limits  PREGNANCY, URINE - Abnormal; Notable for the following components:   Preg Test, Ur POSITIVE (*)    All other components within normal limits  RESP PANEL BY RT-PCR (FLU A&B, COVID) ARPGX2  CBC  LIPASE, BLOOD  URINALYSIS, ROUTINE W REFLEX MICROSCOPIC  TROPONIN I (HIGH SENSITIVITY)  TROPONIN I (HIGH SENSITIVITY)    EKG EKG Interpretation  Date/Time:  Tuesday January 29 2020 09:02:19 EST Ventricular Rate:  80 PR Interval:  154 QRS Duration: 80 QT Interval:  364 QTC Calculation: 419 R Axis:   91 Text Interpretation: Normal sinus rhythm Rightward axis Borderline ECG Confirmed by 07-16-2001 [phone]) on 01/29/2020 9:52:45 AM   Radiology 01/31/2020 OB Comp Less 14 Wks  Result Date: 01/29/2020 CLINICAL DATA:  Generalized abdominal pain EXAM: OBSTETRIC <14 WK ULTRASOUND TECHNIQUE: Transabdominal ultrasound was performed for evaluation of the gestation as well as the maternal uterus and adnexal regions. COMPARISON:  None. FINDINGS: Intrauterine gestational sac: Single Yolk sac:  Yes Embryo:  Yes Cardiac Activity: Yes Heart Rate: 162  bpm MSD:    mm    w     d CRL:   7 mm   6 w 3 d  [place] EDC: 09/20/2020 Subchorionic hemorrhage:  None visualized. Maternal uterus/adnexae: Subchorionic hemorrhage: None Right ovary: Normal Left ovary: Normal Other :None Free fluid:  None IMPRESSION: 1. Single living intrauterine gestation with an estimated gestational age of [redacted] weeks and 3 days. Electronically Signed   By: Signa Kell M.D.   On: 01/29/2020 13:48   DG Chest Port 1 View  Result Date: 01/29/2020 CLINICAL DATA:  Chest pain EXAM: PORTABLE CHEST 1 VIEW COMPARISON:  None FINDINGS: Lungs are clear. Heart size and pulmonary vascularity are normal. No adenopathy. No pneumothorax. No bone lesions. IMPRESSION: Lungs clear.  Cardiac silhouette normal. Electronically Signed   By: Bretta Bang III M.D.   On: 01/29/2020 09:44   US Abdomen Limited RUQ (LIVER/GB)  Result Date: 01/29/2020 CLINICAL DATA:  Right upper quadrant pain. EXAM: ULTRASOUND ABDOMEN LIMITED RIGHT UPPER QUADRANT COMPARISON:  CT 04/12/2012. FINDINGS: Gallbladder: No gallstones or wall thickening visualized. No sonographic Murphy sign noted by sonographer. Common bile duct: Diameter: 3 mm Liver: Increased echogenicity consistent fatty infiltration or hepatocellular disease. No focal hepatic abnormality identified. Portal vein is patent on color Doppler imaging with normal direction of blood flow towards the liver. Other: None. IMPRESSION: 1. No gallstones or biliary distention. 2. Increased hepatic echogenicity consistent fatty infiltration or hepatocellular disease. Electronically Signed   By: Maisie Fus  Register   On: 01/29/2020 13:35    Procedures Procedures (including critical care time)  Medications Ordered in ED Medications - No data to display  ED Course  I have reviewed the triage vital signs and the nursing notes.  Pertinent labs & imaging results that were available during my care of the patient were reviewed by me and considered in my medical  decision making (see chart for details).    MDM Rules/Calculators/A&P                          MDM:  Ekg nonacute chest xray normal.  Abdominal ultrasound and ob ultrasound normal.  Pt reassured. Pt advised to follow up for prenatal care. [place]yleno if any further pain.  Final Clinical Impression(s) / ED Diagnoses Final diagnoses:  Atypical chest pain  Generalized abdominal pain  Less than [redacted] weeks gestation of pregnancy    Rx / DC Orders ED Discharge Orders    None    An After Visit Summary was printed and given to the patient.    Osie Cheeks 01/29/20 1710    Benjiman Core, MD 01/30/20 1530

## 2020-01-30 ENCOUNTER — Telehealth: Payer: Self-pay

## 2020-01-30 NOTE — Telephone Encounter (Signed)
During chart prep for [facility] IP appt 01/31/20, noted that client to [facility] ED for evaluation of RUQ pain on 01/29/2020. OB US completed with EGA = 07/18/19 and EDD = 09/20/2020. [place] report reviewed by E. Sciora CNM. Call to client and appt reschedule for 02/26/2019 with arrival time of 12:45 pm. Client with complaint of yeast infection and counseled could purchase Clotrimazole 1% vaginal cream (7 day only treatment). Client verbalized understanding. Jossie Ng, RN

## 2020-02-26 ENCOUNTER — Ambulatory Visit: Payer: Medicaid Other | Admitting: Advanced Practice Midwife

## 2020-02-26 ENCOUNTER — Encounter: Payer: Self-pay | Admitting: Advanced Practice Midwife

## 2020-02-26 ENCOUNTER — Other Ambulatory Visit: Payer: Self-pay

## 2020-02-26 DIAGNOSIS — Z8759 Personal history of other complications of pregnancy, childbirth and the puerperium: Secondary | ICD-10-CM

## 2020-02-26 DIAGNOSIS — F1010 Alcohol abuse, uncomplicated: Secondary | ICD-10-CM

## 2020-02-26 DIAGNOSIS — F1111 Opioid abuse, in remission: Secondary | ICD-10-CM | POA: Insufficient documentation

## 2020-02-26 DIAGNOSIS — Z7289 Other problems related to lifestyle: Secondary | ICD-10-CM | POA: Insufficient documentation

## 2020-02-26 DIAGNOSIS — Z23 Encounter for immunization: Secondary | ICD-10-CM

## 2020-02-26 DIAGNOSIS — O0991 Supervision of high risk pregnancy, unspecified, first trimester: Secondary | ICD-10-CM | POA: Diagnosis not present

## 2020-02-26 DIAGNOSIS — Z9151 Personal history of suicidal behavior: Secondary | ICD-10-CM | POA: Insufficient documentation

## 2020-02-26 DIAGNOSIS — E669 Obesity, unspecified: Secondary | ICD-10-CM | POA: Insufficient documentation

## 2020-02-26 DIAGNOSIS — F17200 Nicotine dependence, unspecified, uncomplicated: Secondary | ICD-10-CM | POA: Diagnosis not present

## 2020-02-26 DIAGNOSIS — Z87891 Personal history of nicotine dependence: Secondary | ICD-10-CM | POA: Insufficient documentation

## 2020-02-26 DIAGNOSIS — Z62810 Personal history of physical and sexual abuse in childhood: Secondary | ICD-10-CM | POA: Insufficient documentation

## 2020-02-26 DIAGNOSIS — O0990 Supervision of high risk pregnancy, unspecified, unspecified trimester: Secondary | ICD-10-CM | POA: Insufficient documentation

## 2020-02-26 DIAGNOSIS — O0992 Supervision of high risk pregnancy, unspecified, second trimester: Secondary | ICD-10-CM

## 2020-02-26 DIAGNOSIS — F1210 Cannabis abuse, uncomplicated: Secondary | ICD-10-CM

## 2020-02-26 LAB — URINALYSIS
Bilirubin, UA: NEGATIVE
Glucose, UA: NEGATIVE
Leukocytes,UA: NEGATIVE
Nitrite, UA: NEGATIVE
Protein,UA: NEGATIVE
RBC, UA: NEGATIVE
Specific Gravity, UA: 1.025 (ref 1.005–1.030)
Urobilinogen, Ur: 1 mg/dL (ref 0.2–1.0)
pH, UA: 6.5 (ref 5.0–7.5)

## 2020-02-26 LAB — OB RESULTS CONSOLE VARICELLA ZOSTER ANTIBODY, IGG: Varicella: IMMUNE

## 2020-02-26 LAB — HEMOGLOBIN, FINGERSTICK: Hemoglobin: 13.9 g/dL (ref 11.1–15.9)

## 2020-02-26 LAB — WET PREP FOR TRICH, YEAST, CLUE: Trichomonas Exam: NEGATIVE

## 2020-02-26 NOTE — Progress Notes (Signed)
Unable to document following on Maternal Health Education Flowsheet: Given - Birth Control Facts pamphlet, Your Guide to [employer] and [employer] card.  Client has selected [facility] in [place] for pediatric care. Jossie Ng, RN  In house labs reviewed and no intervention required per standing order. [facility] first screen referral faxed with snapshot pages and fax confirmation received. RN will notify client when appt received. Client states having some issues with Medicaid and encouraged to sort out ASAP. Per M. Tacey Heap, [facility] Gerda Diss, client needs to complete presumptive Medicaid today. Client escorted to K. Harris, [facility] Intake Clerk to schedule next [facility] appt and complete presumptive Medicaid papers. Jossie Ng, RN

## 2020-02-26 NOTE — Progress Notes (Signed)
[facility] HEALTH DEPT [facility] [address] [place] [address] [place] [address] [phone]  INITIAL PRENATAL VISIT NOTE  Subjective:  Sandy Cox is a 20 y.o. SWF G2P1001 exsmoker at [redacted]w[redacted]d being seen today to start prenatal care at the [facility] Department. She feels "I love it" about planned pregnancy since Nexplanon removed 6-8 months ago.  22 yo employed FOB feels "he loves it" about pregnancy; they share a 79 yo son and all 3 live together; in supportive 6 year relationship.  Pt has been to ER x1 this pregnancy and had 1 u/s then on 01/28/21 at 6 3/7 wks with EDC=09/20/20 She is currently monitored for the following issues for this high-risk pregnancy and has Migraines; Bipolar 1 disorder (HCC); Obesity BMI=36.3; History of macrosomic infant 9#2 02/2017; Smoker; and Supervision of high risk pregnancy in second trimester on their problem list.  Patient reports N&V.  Contractions: Not present. Vag. Bleeding: None.  Movement: Absent. Denies leaking of fluid.   Indications for ASA therapy (per uptodate) One of the following: Previous pregnancy with preeclampsia, especially early onset and with an adverse outcome No Multifetal gestation No Chronic hypertension No Type 1 or 2 diabetes mellitus No Chronic kidney disease No Autoimmune disease (antiphospholipid syndrome, systemic lupus erythematosus) No  Two or more of the following: Nulliparity No Obesity (body mass index >30 kg/m2) Yes Family history of preeclampsia in mother or sister No Age ?35 years No Sociodemographic characteristics (African American race, low socioeconomic level) No Personal risk factors (eg, previous pregnancy with low birth weight or small for gestational age infant, previous adverse pregnancy outcome [eg, stillbirth], interval >10 years between pregnancies) No   The following portions of the patient's history were reviewed and updated as appropriate:  allergies, current medications, past family history, past medical history, past social history, past surgical history and problem list. Problem list updated.  Objective:   Vitals:   02/26/20 1337 02/26/20 1338  BP: 117/75   Pulse: 91   Temp: 97.8 F (36.6 C)   Weight: 225 lb 6.4 oz (102.2 kg)   Height:  5\' 6"  (1.676 m)    Fetal Status: Fetal Heart Rate (bpm): 160 Fundal Height: 12 cm Movement: Absent  Presentation: Undeterminable   Physical Exam Vitals and nursing note reviewed.  Constitutional:      General: She is not in acute distress.    Appearance: Normal appearance. She is well-developed. She is obese.  HENT:     Head: Normocephalic and atraumatic.     Right Ear: External ear normal.     Left Ear: External ear normal.     Nose: Nose normal. No congestion or rhinorrhea.     Mouth/Throat:     Lips: Pink.     Mouth: Mucous membranes are moist.     Dentition: Normal dentition. No dental caries.     Pharynx: Oropharynx is clear. Uvula midline.  Eyes:     General: No scleral icterus.    Conjunctiva/sclera: Conjunctivae normal.  Neck:     Thyroid: No thyroid mass or thyromegaly.  Cardiovascular:     Rate and Rhythm: Normal rate.     Pulses: Normal pulses.     Comments: Extremities are warm and well perfused Pulmonary:     Effort: Pulmonary effort is normal.     Breath sounds: Normal breath sounds.  Chest:     Chest wall: No mass.  Breasts:     Tanner Score is 5. Breasts are  symmetrical.     Right: Normal. No mass, nipple discharge, skin change or axillary adenopathy.     Left: Normal. No mass, nipple discharge, skin change or axillary adenopathy.    Abdominal:     Palpations: Abdomen is soft.     Tenderness: There is no abdominal tenderness.     Comments: Gravid, soft without tenderness, poor tone, difficult to assess fundal height due to adipose, FHR=160  Genitourinary:    General: Normal vulva.     Exam position: Lithotomy position.     Pubic Area: No rash.       Labia:        Right: No rash.        Left: No rash.      Vagina: Normal. No vaginal discharge (white creamy leukorrhea, ph<4.5).     Cervix: Normal.     Uterus: Normal. Enlarged (Gravid difficult to assess due to increased adipose). Not tender.      Rectum: Normal. No external hemorrhoid.     Comments: FHR=160, difficult to assess uterine size or ovaries due to increased adipose Musculoskeletal:     Right lower leg: No edema.     Left lower leg: No edema.  Lymphadenopathy:     Upper Body:     Right upper body: No axillary adenopathy.     Left upper body: No axillary adenopathy.  Skin:    General: Skin is warm.     Capillary Refill: Capillary refill takes less than 2 seconds.  Neurological:     Mental Status: She is alert.     Assessment and Plan:  Pregnancy: G2P1001 at [redacted]w[redacted]d  1. Obesity, unspecified classification, unspecified obesity type, unspecified whether serious comorbidity present Counseled on 11-20 lb wt gain Desires to take ASA 81 mg daily weeks 12-36  2. History of macrosomic infant Counseled on appropriate weight gain this pregnancy  3. Smoker Pt states last use 01/14/20--counseled not to smoke and dangers of second hand smoke  4. Supervision of high risk pregnancy in second trimester Desires FIRST screen--referral written Suggestions given for daily N&V Pt declines need for counseling currently--please give contact card for Kathreen Cosier, LCSW - TSH - Urine Culture - Varicella zoster antibody, IgG - Chlamydia/GC NAA, Confirmation - Glucose, 1 hour gestational - Hemoglobin A1C - HIV-1/HIV-2 Qualitative RNA - Comprehensive metabolic panel - Prenatal profile without Varicella/Rubella ([id number]) - Protein / creatinine ratio, urine  (Spot) - HCV Ab w Reflex to Quant PCR - [id number] Drug Screen - Pap IG (Image Guided) - WET PREP FOR TRICH, YEAST, CLUE - Hemoglobin, venipuncture - Urinalysis (Urine Dip)    Discussed overview of care and coordination  with inpatient delivery practices including [facility], [facility], [facility] and [facility].   Reviewed Centering pregnancy as standard of care at [facility], oriented to room and showed video. Based on EDD, plan for Cycle     Preterm labor symptoms and general obstetric precautions including but not limited to vaginal bleeding, contractions, leaking of fluid and fetal movement were reviewed in detail with the patient.  Please refer to After Visit Summary for other counseling recommendations.   Return in about 4 weeks (around 03/25/2020) for routine PNC.  No future appointments.  Alberteen Spindle, CNM

## 2020-02-27 DIAGNOSIS — Z6791 Unspecified blood type, Rh negative: Secondary | ICD-10-CM | POA: Insufficient documentation

## 2020-02-27 DIAGNOSIS — O26899 Other specified pregnancy related conditions, unspecified trimester: Secondary | ICD-10-CM | POA: Insufficient documentation

## 2020-02-27 LAB — COMPREHENSIVE METABOLIC PANEL
ALT: 14 IU/L (ref 0–32)
AST: 19 IU/L (ref 0–40)
Albumin/Globulin Ratio: 1.4 (ref 1.2–2.2)
Albumin: 3.9 g/dL (ref 3.9–5.0)
Alkaline Phosphatase: 48 IU/L (ref 44–121)
BUN/Creatinine Ratio: 8 — ABNORMAL LOW (ref 9–23)
BUN: 5 mg/dL — ABNORMAL LOW (ref 6–20)
Bilirubin Total: 0.3 mg/dL (ref 0.0–1.2)
CO2: 23 mmol/L (ref 20–29)
Calcium: 8.9 mg/dL (ref 8.7–10.2)
Chloride: 103 mmol/L (ref 96–106)
Creatinine, Ser: 0.65 mg/dL (ref 0.57–1.00)
GFR calc Af Amer: 147 mL/min/{1.73_m2} (ref >59)
GFR calc non Af Amer: 127 mL/min/{1.73_m2} (ref >59)
Globulin, Total: 2.8 g/dL (ref 1.5–4.5)
Glucose: 110 mg/dL — ABNORMAL HIGH (ref 65–99)
Potassium: 3.4 mmol/L — ABNORMAL LOW (ref 3.5–5.2)
Sodium: 137 mmol/L (ref 134–144)
Total Protein: 6.7 g/dL (ref 6.0–8.5)

## 2020-02-27 LAB — PAP IG (IMAGE GUIDED): PAP Smear Comment: 0

## 2020-02-27 LAB — CBC/D/PLT+RPR+RH+ABO+AB SCR
Antibody Screen: NEGATIVE
Basophils Absolute: 0 10*3/uL (ref 0.0–0.2)
Basos: 1 %
EOS (ABSOLUTE): 0.1 10*3/uL (ref 0.0–0.4)
Eos: 1 %
Hematocrit: 40.5 % (ref 34.0–46.6)
Hemoglobin: 14.1 g/dL (ref 11.1–15.9)
Hepatitis B Surface Ag: NEGATIVE
Immature Grans (Abs): 0 10*3/uL (ref 0.0–0.1)
Immature Granulocytes: 0 %
Lymphocytes Absolute: 1.8 10*3/uL (ref 0.7–3.1)
Lymphs: 32 %
MCH: 30.1 pg (ref 26.6–33.0)
MCHC: 34.8 g/dL (ref 31.5–35.7)
MCV: 86 fL (ref 79–97)
Monocytes Absolute: 0.8 10*3/uL (ref 0.1–0.9)
Monocytes: 14 %
Neutrophils Absolute: 2.9 10*3/uL (ref 1.4–7.0)
Neutrophils: 52 %
Platelets: 173 10*3/uL (ref 150–450)
RBC: 4.69 x10E6/uL (ref 3.77–5.28)
RDW: 12.4 % (ref 11.7–15.4)
RPR Ser Ql: NONREACTIVE
Rh Factor: NEGATIVE
WBC: 5.6 10*3/uL (ref 3.4–10.8)

## 2020-02-27 LAB — VARICELLA ZOSTER ANTIBODY, IGG: Varicella zoster IgG: 408 index (ref >165)

## 2020-02-27 LAB — GLUCOSE, 1 HOUR GESTATIONAL: Gestational Diabetes Screen: 104 mg/dL (ref 65–139)

## 2020-02-27 LAB — HIV-1/HIV-2 QUALITATIVE RNA
HIV-1 RNA, Qualitative: NONREACTIVE
HIV-2 RNA, Qualitative: NONREACTIVE

## 2020-02-27 LAB — TSH: TSH: 1.26 u[IU]/mL (ref 0.450–4.500)

## 2020-02-27 LAB — HGB A1C W/O EAG: Hgb A1c MFr Bld: 5.1 % (ref 4.8–5.6)

## 2020-02-27 LAB — HCV INTERPRETATION

## 2020-02-27 LAB — HCV AB W REFLEX TO QUANT PCR: HCV Ab: <0.1 s/co ratio (ref 0.0–0.9)

## 2020-02-28 ENCOUNTER — Other Ambulatory Visit: Payer: Self-pay | Admitting: Advanced Practice Midwife

## 2020-02-28 ENCOUNTER — Encounter: Payer: Self-pay | Admitting: Advanced Practice Midwife

## 2020-02-28 ENCOUNTER — Telehealth: Payer: Self-pay

## 2020-02-28 DIAGNOSIS — Z369 Encounter for antenatal screening, unspecified: Secondary | ICD-10-CM

## 2020-02-28 LAB — CHLAMYDIA/GC NAA, CONFIRMATION
Chlamydia trachomatis, NAA: NEGATIVE
Neisseria gonorrhoeae, NAA: NEGATIVE

## 2020-02-28 NOTE — Telephone Encounter (Signed)
Call to client with [facility] first screen appt on 03/13/2020 at 12 noon (genetic counseling) and 1 pm ([place]). Per client, she is aware of appt already via My Chart. Jossie Ng, RN

## 2020-02-29 LAB — URINE CULTURE

## 2020-03-04 ENCOUNTER — Encounter: Payer: Self-pay | Admitting: Advanced Practice Midwife

## 2020-03-04 DIAGNOSIS — O2341 Unspecified infection of urinary tract in pregnancy, first trimester: Secondary | ICD-10-CM | POA: Insufficient documentation

## 2020-03-05 ENCOUNTER — Telehealth: Payer: Self-pay

## 2020-03-05 NOTE — Telephone Encounter (Signed)
Client counseled has UTI and antibiotic treatment has been prescribed by E. Sciora CNM. Per client, she has talked with Medicaid in [facility] and told she has full pregnancy Medicaid and should be receiving card in 1 - 2 weeks. Client requested prescription be called in to [facility]). Per written order of E. Sciora CNM (attached to lab report), Macrobid 100 mg po BID x7 days (dispense 14 with no refills) called in. Client aware needs to start medication today. Jossie Ng, RN

## 2020-03-06 ENCOUNTER — Telehealth: Payer: Self-pay

## 2020-03-06 NOTE — Telephone Encounter (Signed)
Client unable to schedule follow-up [facility] RV appt at 02/26/2020 visit as schedule in February not yet available. Call to client and 4 week follow-up appt scheduled for 03/25/2020. Jossie Ng, RN

## 2020-03-07 ENCOUNTER — Telehealth: Payer: Self-pay | Admitting: Family Medicine

## 2020-03-07 NOTE — Telephone Encounter (Signed)
Call returned to patient. No answer. LMOM to return call. Harvie Heck, RN

## 2020-03-07 NOTE — Telephone Encounter (Signed)
Please call me I have a UTI and need meds for pain

## 2020-03-10 NOTE — Telephone Encounter (Signed)
Call to patient. Patient wants to know what she can take for pain with uti while pregnant. RN assured patient can take tylenol and continue the antibiotic she was prescribed.   Harvie Heck, RN

## 2020-03-13 ENCOUNTER — Other Ambulatory Visit: Payer: Self-pay

## 2020-03-13 ENCOUNTER — Ambulatory Visit (HOSPITAL_BASED_OUTPATIENT_CLINIC_OR_DEPARTMENT_OTHER): Payer: Medicaid Other

## 2020-03-13 ENCOUNTER — Ambulatory Visit: Payer: Medicaid Other | Attending: Obstetrics

## 2020-03-13 VITALS — BP 127/72 | HR 101 | Temp 97.6°F | Ht 66.0 in | Wt 226.0 lb

## 2020-03-13 DIAGNOSIS — O09291 Supervision of pregnancy with other poor reproductive or obstetric history, first trimester: Secondary | ICD-10-CM | POA: Insufficient documentation

## 2020-03-13 DIAGNOSIS — O26899 Other specified pregnancy related conditions, unspecified trimester: Secondary | ICD-10-CM

## 2020-03-13 DIAGNOSIS — Z3A12 12 weeks gestation of pregnancy: Secondary | ICD-10-CM

## 2020-03-13 DIAGNOSIS — Z8759 Personal history of other complications of pregnancy, childbirth and the puerperium: Secondary | ICD-10-CM

## 2020-03-13 DIAGNOSIS — E669 Obesity, unspecified: Secondary | ICD-10-CM

## 2020-03-13 DIAGNOSIS — O99211 Obesity complicating pregnancy, first trimester: Secondary | ICD-10-CM

## 2020-03-13 DIAGNOSIS — Z369 Encounter for antenatal screening, unspecified: Secondary | ICD-10-CM

## 2020-03-13 DIAGNOSIS — Z36 Encounter for antenatal screening for chromosomal anomalies: Secondary | ICD-10-CM | POA: Diagnosis not present

## 2020-03-13 NOTE — Progress Notes (Signed)
Referring provider:  [facility] Department Length of Consultation: 25 minutes   Sandy Cox  was referred to [facility] at [facility] for genetic counseling to review prenatal screening and testing options.  This note summarizes the information we discussed.    We offered the following routine screening tests for this pregnancy:  Cell free fetal DNA testing from maternal blood may be used to determine whether a baby is at high risk for Down syndrome, trisomy 52, or trisomy 22.  This test utilizes a maternal blood sample and DNA sequencing technology to isolate circulating cell free fetal DNA from maternal plasma.  The fetal DNA can then be analyzed for DNA sequences that are derived from the three most common chromosomes involved in aneuploidy, chromosomes 13, 18, and 21.  If the overall amount of DNA is greater than the expected level for any of these chromosomes, aneuploidy is suspected.  The detection rate for Down syndrome and trisomy 18 is >99% and the detection rate for trisomy 13 is >91%. While we do not consider it a replacement for invasive testing and karyotype analysis, a negative result from this testing would be reassuring, though not a guarantee of a normal chromosome complement for the baby.  An abnormal result is certainly suggestive of an abnormal chromosome complement, though we would still recommend CVS or amniocentesis to confirm any findings from this testing. This testing can also assess for the sex chromosomes and can detect approximately 96% of sex chromosome aneuploidies and determine fetal gender with >99% confidence.    First trimester screening, which includes nuchal translucency ultrasound screen and first trimester maternal serum marker screening.  The nuchal translucency has approximately an 80% detection rate for Down syndrome and can be positive for other chromosome abnormalities as well as congenital heart defects.  When combined with a maternal  serum marker screening, the detection rate is up to 90% for Down syndrome and up to 97% for trisomy 18.     Maternal serum marker screening, a blood test that measures pregnancy proteins, can provide risk assessments for Down syndrome, trisomy 18, and open neural tube defects (spina bifida, anencephaly). Because it does not directly examine the fetus, it cannot positively diagnose or rule out these problems.  Targeted ultrasound uses high frequency sound waves to create an image of the developing fetus.  An ultrasound is often recommended as a routine means of evaluating the pregnancy.  It is also used to screen for fetal anatomy problems (for example, a heart defect) that might be suggestive of a chromosomal or other abnormality.   Should these screening tests indicate an increased concern, then the following additional testing options would be offered:  The chorionic villus sampling procedure is available for first trimester chromosome analysis.  This involves the withdrawal of a small amount of chorionic villi (tissue from the developing placenta).  Risk of pregnancy loss is estimated to be approximately 1 in 200 to 1 in 100 (0.5 to 1%).  There is approximately a 1% (1 in 100) chance that the CVS chromosome results will be unclear.  Chorionic villi cannot be tested for neural tube defects.     Amniocentesis involves the removal of a small amount of amniotic fluid from the sac surrounding the fetus with the use of a thin needle inserted through the maternal abdomen and uterus.  Ultrasound guidance is used throughout the procedure.  Fetal cells from amniotic fluid are directly evaluated and > 99.5% of chromosome problems and >  98% of open neural tube defects can be detected. This procedure is generally performed after the 15th week of pregnancy.  The main risks to this procedure include complications leading to miscarriage in less than 1 in 200 cases (0.5%).  Cystic Fibrosis and Spinal Muscular Atrophy  (SMA) screening were also discussed with the patient. Both conditions are recessive, which means that both parents must be carriers in order to have a child with the disease.  Cystic fibrosis (CF) is one of the most common genetic conditions in persons of Caucasian ancestry.  This condition occurs in approximately 1 in 2,500 Caucasian persons and results in thickened secretions in the lungs, digestive, and reproductive systems.  For a baby to be at risk for having CF, both of the parents must be carriers for this condition.  Approximately 1 in 32 Caucasian persons is a carrier for CF.  Current carrier testing looks for the most common mutations in the gene for CF and can detect approximately 90% of carriers in the Caucasian population.  This means that the carrier screening can greatly reduce, but cannot eliminate, the chance for an individual to have a child with CF.  If an individual is found to be a carrier for CF, then carrier testing would be available for the partner. As part of [place]'s newborn screening profile, all babies born in the state of [place] will have a two-tier screening process.  Specimens are first tested to determine the concentration of immunoreactive trypsinogen (IRT).  The top 5% of specimens with the highest IRT values then undergo DNA testing using a panel of over 40 common CF mutations. SMA is a neurodegenerative disorder that leads to atrophy of skeletal muscle and overall weakness.  This condition is also more prevalent in the Caucasian population, with 1 in 40-1 in 60 persons being a carrier and 1 in 6,000-1 in 10,000 children being affected.  There are multiple forms of the disease, with some causing death in infancy to other forms with survival into adulthood.  The genetics of SMA is complex, but carrier screening can detect up to 95% of carriers in the Caucasian population.  Similar to CF, a negative result can greatly reduce, but cannot eliminate, the chance to have  a child with SMA. Hemoglobinopathy screening and Fragile X carrier screening were also made available.  We obtained a detailed family history and pregnancy history.  The patient reported that she and her mother have bipolar disorder.  We reviewed that most mental health conditions are thought to be due to a combination of genetic as well as other factors.  The specific genetic factors are not well understood at this time, though we do expect Sandy Cox's children to be at an increased risk for a mental health diagnosis.  She is not currently taking medication for her bipolar and said that she is managing well.  She is planning to speak with her provider about an appointment with a counselor next week. We encouraged her to follow up on that plan and to be mindful of her mental health both during the pregnancy and in the postpartum period. The remainder of the family history was reported to be unremarkable for birth defects, intellectual delays, recurrent pregnancy loss or known chromosome abnormalities.  Sandy Cox stated that this is her second pregnancy with her current partner. They have a healthy 42 year old son.  She reported no complications or exposures to medications, alcohol, tobacco or recreational drugs.  After consideration of the  options, Sandy Cox elected to proceed with cell free fetal DNA testing as well as carrier screening for CF, SMA, Hemoglobinopathies and Fragile X syndrome.  An ultrasound was performed at the time of the visit.  The gestational age was consistent with 12 weeks.  Fetal anatomy could not be assessed due to early gestational age.  Please refer to the ultrasound report for details of that study.  Sandy Cox was encouraged to call with questions or concerns.  We can be contacted at [phone].  Plan of care: Marland Kitchen MaterniT21 drawn today . Carrier screening drawn today . Pt scheduled to return for anatomy ultrasound at [redacted] weeks gestation.   Labs ordered:  MaterniT21 PLUS with SCA, Inheritest (CF, SMA, FragileX), hemoglobinopathy profile  Cherly Anderson, MS, CGC

## 2020-03-14 ENCOUNTER — Encounter: Payer: Self-pay | Admitting: Advanced Practice Midwife

## 2020-03-14 LAB — MISC LABCORP TEST (SEND OUT): Labcorp test code: 451954

## 2020-03-15 LAB — HGB FRACTIONATION CASCADE
Hgb A2: 2.3 % (ref 1.8–3.2)
Hgb A: 97.7 % (ref 96.4–98.8)
Hgb F: 0 % (ref 0.0–2.0)
Hgb S: 0 %

## 2020-03-18 ENCOUNTER — Telehealth: Payer: Self-pay | Admitting: Obstetrics and Gynecology

## 2020-03-18 LAB — MATERNIT21 PLUS CORE+SCA
Fetal Fraction: 8
Monosomy X (Turner Syndrome): NOT DETECTED
Result (T21): NEGATIVE
Trisomy 13 (Patau syndrome): NEGATIVE
Trisomy 18 (Edwards syndrome): NEGATIVE
Trisomy 21 (Down syndrome): NEGATIVE
XXX (Triple X Syndrome): NOT DETECTED
XXY (Klinefelter Syndrome): NOT DETECTED
XYY (Jacobs Syndrome): NOT DETECTED

## 2020-03-18 NOTE — Telephone Encounter (Signed)
The patient was informed of the results of her recent MaterniT21 testing which yielded NEGATIVE results.  The patient's specimen showed DNA consistent with two copies of chromosomes 21, 18 and 13.  The sensitivity for trisomy 40, trisomy 9 and trisomy 1 using this testing are reported as 99.1%, 99.9% and 91.7% respectively.  Thus, while the results of this testing are highly accurate, they are not considered diagnostic at this time.  Should more definitive information be desired, the patient may still consider amniocentesis.   As requested to know by the patient, sex chromosome analysis was included for this sample.  Results were disclosed by phone to a family friend per patient request.  This is predicted with >99% accuracy. This testing also screens for sex chromosome conditions with greater than 96% accuracy and was negative for those conditions.   A maternal serum AFP only should be considered if screening for neural tube defects is desired.  Sandy Cox also elected carrier screening.  Hemoglobinopathy evaluation was normal (AA), which combined with a normal MCV (86), significantly reduces the chance that the patient is a carrier for any hemoglobin variant.  Screening results for CF, SMA and Fragile X syndrome are still pending.  We may be reached at [phone] with any questions or concerns.   Sandy Anderson, MS, CGC

## 2020-03-19 ENCOUNTER — Telehealth: Payer: Self-pay

## 2020-03-19 NOTE — Telephone Encounter (Signed)
Received call from [name], [facility] OBCM who has been unable to contact client. Phone number verified and email provided. [name] requested at client's next appt (03/25/20) that clinic give client her phone number which is [phone]. Above entered on pink sticky note in client's chart. Jossie Ng, RN

## 2020-03-22 LAB — 789231 7+OXYCODONE-BUND
Amphetamines, Urine: NEGATIVE ng/mL
BENZODIAZ UR QL: NEGATIVE ng/mL
Barbiturate screen, urine: NEGATIVE ng/mL
Cocaine (Metab.): NEGATIVE ng/mL
OPIATE SCREEN URINE: NEGATIVE ng/mL
Oxycodone/Oxymorphone, Urine: NEGATIVE ng/mL
PCP Quant, Ur: NEGATIVE ng/mL

## 2020-03-22 LAB — PROTEIN / CREATININE RATIO, URINE
Creatinine, Urine: 225.6 mg/dL
Protein, Ur: 13.8 mg/dL
Protein/Creat Ratio: 61 mg/g creat (ref 0–200)

## 2020-03-22 LAB — CANNABINOID CONFIRMATION, UR
CANNABINOIDS: POSITIVE — AB
Carboxy THC GC/MS Conf: 3425 ng/mL

## 2020-03-25 ENCOUNTER — Ambulatory Visit: Payer: Self-pay

## 2020-03-27 ENCOUNTER — Telehealth: Payer: Self-pay

## 2020-03-27 ENCOUNTER — Ambulatory Visit: Payer: Medicaid Other | Admitting: Physician Assistant

## 2020-03-27 ENCOUNTER — Encounter: Payer: Self-pay | Admitting: Physician Assistant

## 2020-03-27 ENCOUNTER — Other Ambulatory Visit: Payer: Self-pay

## 2020-03-27 VITALS — BP 116/77 | HR 100 | Temp 97.1°F | Wt 224.0 lb

## 2020-03-27 DIAGNOSIS — O0992 Supervision of high risk pregnancy, unspecified, second trimester: Secondary | ICD-10-CM

## 2020-03-27 DIAGNOSIS — O99210 Obesity complicating pregnancy, unspecified trimester: Secondary | ICD-10-CM | POA: Insufficient documentation

## 2020-03-27 DIAGNOSIS — O2341 Unspecified infection of urinary tract in pregnancy, first trimester: Secondary | ICD-10-CM

## 2020-03-27 DIAGNOSIS — F1210 Cannabis abuse, uncomplicated: Secondary | ICD-10-CM

## 2020-03-27 DIAGNOSIS — E669 Obesity, unspecified: Secondary | ICD-10-CM

## 2020-03-27 MED ORDER — ASPIRIN EC 81 MG PO TBEC: 81.0000 mg | DELAYED_RELEASE_TABLET | Freq: Every day | ORAL | Status: AC

## 2020-03-27 NOTE — Telephone Encounter (Signed)
Refer to other phone encounter 03/27/2020. Jossie Ng, RN
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
## 2020-03-27 NOTE — Telephone Encounter (Signed)
Client had MHC RV appt this am and needs 4 week MHC RV (04/24/2020) which was not scheduled today. Call to client and left message to call and schedule 4 week MHC RV appt with clerical or RN if has questions. Number to call provided. Jossie Ng, RN

## 2020-03-27 NOTE — Progress Notes (Signed)
Aware of 05/01/2020 anatomy US at [facility]). Given written phone number of Lavelle, OBCM in [place] per her request and client requested to contact her. Completed antibiotic for UTI - continuing to have similar symptoms. Encouraged to notify provider. Desires AFP - appt scheduled for next week when will be appropriate EGA and reminder card given. Jossie Ng, RN

## 2020-03-28 NOTE — Telephone Encounter (Signed)
Phone call to pt. Pt scheduled for MH follow-up appt on 04/24/20.

## 2020-03-29 LAB — URINE CULTURE

## 2020-03-31 ENCOUNTER — Other Ambulatory Visit: Payer: Self-pay

## 2020-04-01 ENCOUNTER — Telehealth: Payer: Self-pay | Admitting: Obstetrics and Gynecology

## 2020-04-01 NOTE — Telephone Encounter (Signed)
Called to give negative results for Inheritest carrier screening - the patient answered and asked if I could call back, as she was at a funeral.  Will call again later. Cherly Anderson, MS, CGC

## 2020-04-02 ENCOUNTER — Telehealth: Payer: Self-pay

## 2020-04-02 NOTE — Telephone Encounter (Signed)
Client DNKA for AFP on 03/31/2020. Call to client who states forgot about appt due to attending a viewing and funeral. Client requested appt be rescheduled for 04/07/2020 and to arrive at 1245. Jossie Ng, RN

## 2020-04-03 ENCOUNTER — Telehealth: Payer: Self-pay | Admitting: Obstetrics and Gynecology

## 2020-04-03 NOTE — Telephone Encounter (Signed)
  We spoke with Sandy Cox to review that the results of the recent carrier screening are now available.  The patient elected to undergo carrier screening for the following conditions Cystic fibrosis (CF), spinal muscular atrophy (SMA) and Fragile X syndrome through the [employer]her[employer] screening panel.  To review, CF is a genetic condition that occurs most often in Caucasian persons.  It primarily affects the lungs, digestive, and reproductive systems.  For someone to be at risk for having CF, both of their parents must be carriers for CF.  The testing can detect many persons who are carriers for CF and therefore determine if the pregnancy is at an increased risk for this condition.  The blood test results were negative when examined for the 97 most common mutations (or changes) in the gene for CF.  This means that she does not carry any of the most common changes in this gene.  Testing for these 97 mutations detects approximately 90% of carriers who are Caucasian.  Therefore, the chance that she is a carrier based on this negative result has been reduced from 1 in 25 to approximately 1 in 343.  Because this testing cannot detect all changes that may cause CF, we cannot eliminate the chance that this individual is a carrier completely.  SMA is also a recessive genetic condition with variable age of onset and severity caused by mutations in the SMN1 gene.  This carrier testing assesses the number of copies of this gene.  Persons with one copy of the SMN1 gene are carriers, and those with no copies are affected with the condition.  Individuals with two or more copies have a reduced chance to be a carrier.  Not all mutations can be detected with this testing, though it can detect 94.8% of carriers in the Caucasian population.  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 *3+80T>G SNP, thus reducing her chance to be a carrier from 1 in 47 to 1 in 921.  Again, this  testing cannot eliminate the chance to have a child with SMA, but dramatically reduces the chance.    Fragile X syndrome is the most common inherited cause of intellectual disabilities and often has autism as a feature.  It is caused by a change, or expansion, of the DNA in the gene FMR1.  Carrier screening is used to determine the number of repeats in this region of the gene and to determine the likelihood of having a child with this condition. Sandy Cox's results showed copy numbers of 24 and 31, which are within the normal range.  This negative result means that Sandy Cox is not at risk for having a child with Fragile X syndrome, but cannot exclude other causes for autism or intellectual delays.    We encouraged the patient to call with any questions or concerns as they arise.  We may be reached at [phone].  Cherly Anderson, MS, CGC

## 2020-04-07 ENCOUNTER — Other Ambulatory Visit: Payer: Self-pay

## 2020-04-07 ENCOUNTER — Encounter (HOSPITAL_COMMUNITY): Payer: Self-pay

## 2020-04-07 ENCOUNTER — Emergency Department (HOSPITAL_COMMUNITY)
Admission: EM | Admit: 2020-04-07 | Discharge: 2020-04-07 | Disposition: A | Discharge status: Home / Self Care | Payer: Medicaid Other | Attending: Emergency Medicine | Admitting: Emergency Medicine

## 2020-04-07 DIAGNOSIS — R42 Dizziness and giddiness: Secondary | ICD-10-CM

## 2020-04-07 DIAGNOSIS — Z87891 Personal history of nicotine dependence: Secondary | ICD-10-CM | POA: Diagnosis not present

## 2020-04-07 DIAGNOSIS — O219 Vomiting of pregnancy, unspecified: Secondary | ICD-10-CM | POA: Diagnosis not present

## 2020-04-07 DIAGNOSIS — O99282 Endocrine, nutritional and metabolic diseases complicating pregnancy, second trimester: Secondary | ICD-10-CM | POA: Diagnosis not present

## 2020-04-07 DIAGNOSIS — E860 Dehydration: Secondary | ICD-10-CM | POA: Diagnosis not present

## 2020-04-07 DIAGNOSIS — Z3A20 20 weeks gestation of pregnancy: Secondary | ICD-10-CM | POA: Diagnosis not present

## 2020-04-07 DIAGNOSIS — O26892 Other specified pregnancy related conditions, second trimester: Secondary | ICD-10-CM | POA: Diagnosis present

## 2020-04-07 LAB — URINALYSIS, ROUTINE W REFLEX MICROSCOPIC
Bilirubin Urine: NEGATIVE
Glucose, UA: NEGATIVE mg/dL
Hgb urine dipstick: NEGATIVE
Ketones, ur: 20 mg/dL — AB
Leukocytes,Ua: NEGATIVE
Nitrite: NEGATIVE
Protein, ur: 30 mg/dL — AB
Specific Gravity, Urine: 1.019 (ref 1.005–1.030)
pH: 8 (ref 5.0–8.0)

## 2020-04-07 LAB — COMPREHENSIVE METABOLIC PANEL
ALT: 11 U/L (ref 0–44)
AST: 17 U/L (ref 15–41)
Albumin: 3.3 g/dL — ABNORMAL LOW (ref 3.5–5.0)
Alkaline Phosphatase: 35 U/L — ABNORMAL LOW (ref 38–126)
Anion gap: 5 (ref 5–15)
BUN: 6 mg/dL (ref 6–20)
CO2: 21 mmol/L — ABNORMAL LOW (ref 22–32)
Calcium: 8.7 mg/dL — ABNORMAL LOW (ref 8.9–10.3)
Chloride: 108 mmol/L (ref 98–111)
Creatinine, Ser: 0.46 mg/dL (ref 0.44–1.00)
GFR, Estimated: >60 mL/min (ref >60)
Glucose, Bld: 87 mg/dL (ref 70–99)
Potassium: 3.6 mmol/L (ref 3.5–5.1)
Sodium: 134 mmol/L — ABNORMAL LOW (ref 135–145)
Total Bilirubin: 0.8 mg/dL (ref 0.3–1.2)
Total Protein: 6.6 g/dL (ref 6.5–8.1)

## 2020-04-07 LAB — CBC WITH DIFFERENTIAL/PLATELET
Abs Immature Granulocytes: 0.08 10*3/uL — ABNORMAL HIGH (ref 0.00–0.07)
Basophils Absolute: 0 10*3/uL (ref 0.0–0.1)
Basophils Relative: 0 %
Eosinophils Absolute: 0.1 10*3/uL (ref 0.0–0.5)
Eosinophils Relative: 1 %
HCT: 39 % (ref 36.0–46.0)
Hemoglobin: 13.7 g/dL (ref 12.0–15.0)
Immature Granulocytes: 1 %
Lymphocytes Relative: 20 %
Lymphs Abs: 2.5 10*3/uL (ref 0.7–4.0)
MCH: 31.3 pg (ref 26.0–34.0)
MCHC: 35.1 g/dL (ref 30.0–36.0)
MCV: 89 fL (ref 80.0–100.0)
Monocytes Absolute: 1 10*3/uL (ref 0.1–1.0)
Monocytes Relative: 8 %
Neutro Abs: 8.9 10*3/uL — ABNORMAL HIGH (ref 1.7–7.7)
Neutrophils Relative %: 70 %
Platelets: 202 10*3/uL (ref 150–400)
RBC: 4.38 MIL/uL (ref 3.87–5.11)
RDW: 12.7 % (ref 11.5–15.5)
WBC: 12.6 10*3/uL — ABNORMAL HIGH (ref 4.0–10.5)
nRBC: 0 % (ref 0.0–0.2)

## 2020-04-07 LAB — LIPASE, BLOOD: Lipase: 24 U/L (ref 11–51)

## 2020-04-07 MED ORDER — SODIUM CHLORIDE 0.9 % IV BOLUS
1000.0000 mL | Freq: Once | INTRAVENOUS | Status: AC
  Administered 2020-04-07: 1000 mL via INTRAVENOUS

## 2020-04-07 NOTE — ED Notes (Signed)
Pt tolerating oral intake at this time.
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
## 2020-04-07 NOTE — Discharge Instructions (Addendum)
Return if any problems.  See your Physician for recheck  °

## 2020-04-07 NOTE — ED Triage Notes (Signed)
Pt presents to ED with complaints of dizziness, pain in right side. Pt states she passed out x 1, unknown down time. Pt [redacted] weeks pregnant, does not have OB doctor

## 2020-04-07 NOTE — ED Notes (Signed)
Pt c/o of intermittent RUQ pain that radiates down to pelvic area.

## 2020-04-08 ENCOUNTER — Other Ambulatory Visit: Payer: Medicaid Other

## 2020-04-08 ENCOUNTER — Other Ambulatory Visit: Payer: Self-pay

## 2020-04-08 VITALS — Wt 224.0 lb

## 2020-04-08 DIAGNOSIS — O0992 Supervision of high risk pregnancy, unspecified, second trimester: Secondary | ICD-10-CM

## 2020-04-08 LAB — 789231 7+OXYCODONE-BUND
Amphetamines, Urine: NEGATIVE ng/mL
BENZODIAZ UR QL: NEGATIVE ng/mL
Barbiturate screen, urine: NEGATIVE ng/mL
Cocaine (Metab.): NEGATIVE ng/mL
OPIATE SCREEN URINE: NEGATIVE ng/mL
Oxycodone/Oxymorphone, Urine: NEGATIVE ng/mL
PCP Quant, Ur: NEGATIVE ng/mL

## 2020-04-08 LAB — CANNABINOID CONFIRMATION, UR
CANNABINOIDS: POSITIVE — AB
Carboxy THC GC/MS Conf: >750 ng/mL

## 2020-04-08 NOTE — Progress Notes (Signed)
Presents to Nurse [facility] for AFP only. Client aware of next [facility] RV appt on 04/24/2020 with arrival time of 3:30 pm. Jossie Ng, RN

## 2020-04-16 NOTE — Addendum Note (Signed)
Addended by: Heywood Bene on: 04/16/2020 10:38 AM   Modules accepted: Orders

## 2020-04-24 ENCOUNTER — Encounter: Payer: Self-pay | Admitting: Physician Assistant

## 2020-04-24 ENCOUNTER — Other Ambulatory Visit: Payer: Self-pay

## 2020-04-24 ENCOUNTER — Ambulatory Visit: Payer: Medicaid Other | Admitting: Physician Assistant

## 2020-04-24 VITALS — BP 106/67 | HR 89 | Temp 98.3°F | Wt 230.2 lb

## 2020-04-24 DIAGNOSIS — O0992 Supervision of high risk pregnancy, unspecified, second trimester: Secondary | ICD-10-CM

## 2020-04-24 DIAGNOSIS — F1210 Cannabis abuse, uncomplicated: Secondary | ICD-10-CM

## 2020-04-24 NOTE — Progress Notes (Signed)
Presents for [facility] RV at 18.5 weeks. Take PNV daily. Denies ED/Hospital visit since last RV. Sharlyne Pacas, RN
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
## 2020-04-29 ENCOUNTER — Other Ambulatory Visit: Payer: Self-pay | Admitting: Advanced Practice Midwife

## 2020-04-29 DIAGNOSIS — O09292 Supervision of pregnancy with other poor reproductive or obstetric history, second trimester: Secondary | ICD-10-CM

## 2020-04-29 DIAGNOSIS — O99212 Obesity complicating pregnancy, second trimester: Secondary | ICD-10-CM

## 2020-05-01 ENCOUNTER — Other Ambulatory Visit: Payer: Self-pay

## 2020-05-01 ENCOUNTER — Ambulatory Visit: Payer: Medicaid Other | Attending: Obstetrics and Gynecology

## 2020-05-01 DIAGNOSIS — Z3A19 19 weeks gestation of pregnancy: Secondary | ICD-10-CM | POA: Diagnosis not present

## 2020-05-01 DIAGNOSIS — O99212 Obesity complicating pregnancy, second trimester: Secondary | ICD-10-CM | POA: Insufficient documentation

## 2020-05-01 DIAGNOSIS — O09292 Supervision of pregnancy with other poor reproductive or obstetric history, second trimester: Secondary | ICD-10-CM | POA: Insufficient documentation

## 2020-05-01 DIAGNOSIS — E669 Obesity, unspecified: Secondary | ICD-10-CM | POA: Diagnosis not present

## 2020-05-05 ENCOUNTER — Encounter: Payer: Self-pay | Admitting: Advanced Practice Midwife

## 2020-05-05 ENCOUNTER — Telehealth: Payer: Self-pay

## 2020-05-05 NOTE — Telephone Encounter (Signed)
Call to client to schedule follow-up [facility] RV appt on 05/22/2020. Appt schedule and client aware to arrive at 1:00 pm. Jossie Ng, RN

## 2020-05-22 ENCOUNTER — Ambulatory Visit: Payer: Medicaid Other | Admitting: Physician Assistant

## 2020-05-22 ENCOUNTER — Other Ambulatory Visit: Payer: Self-pay

## 2020-05-22 ENCOUNTER — Ambulatory Visit: Payer: Self-pay

## 2020-05-22 ENCOUNTER — Encounter: Payer: Self-pay | Admitting: Physician Assistant

## 2020-05-22 VITALS — BP 127/79 | HR 101 | Temp 99.5°F | Wt 226.2 lb

## 2020-05-22 DIAGNOSIS — O26899 Other specified pregnancy related conditions, unspecified trimester: Secondary | ICD-10-CM

## 2020-05-22 DIAGNOSIS — Z6791 Unspecified blood type, Rh negative: Secondary | ICD-10-CM

## 2020-05-22 DIAGNOSIS — O0992 Supervision of high risk pregnancy, unspecified, second trimester: Secondary | ICD-10-CM | POA: Diagnosis not present

## 2020-05-22 DIAGNOSIS — O99210 Obesity complicating pregnancy, unspecified trimester: Secondary | ICD-10-CM

## 2020-05-22 DIAGNOSIS — F319 Bipolar disorder, unspecified: Secondary | ICD-10-CM

## 2020-05-22 DIAGNOSIS — O99212 Obesity complicating pregnancy, second trimester: Secondary | ICD-10-CM

## 2020-05-22 DIAGNOSIS — F1210 Cannabis abuse, uncomplicated: Secondary | ICD-10-CM

## 2020-05-22 DIAGNOSIS — O26892 Other specified pregnancy related conditions, second trimester: Secondary | ICD-10-CM
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
## 2020-05-22 NOTE — Progress Notes (Signed)
Patient here for MH RV at 22 5/7. States she is having nausea that started 2-3 weeks ago. States she can't keep food down and drinks only water and mountain dew. CHMRP and PHQ9 today. S/S PTL reviewed and literature given. Burt Knack, RN

## 2020-05-26 ENCOUNTER — Telehealth: Payer: Self-pay

## 2020-05-26 ENCOUNTER — Emergency Department (HOSPITAL_COMMUNITY)
Admission: EM | Admit: 2020-05-26 | Discharge: 2020-05-26 | Disposition: A | Discharge status: Home / Self Care | Payer: Medicaid Other | Attending: Emergency Medicine | Admitting: Emergency Medicine

## 2020-05-26 ENCOUNTER — Encounter (HOSPITAL_COMMUNITY): Payer: Self-pay | Admitting: Emergency Medicine

## 2020-05-26 ENCOUNTER — Other Ambulatory Visit: Payer: Self-pay

## 2020-05-26 DIAGNOSIS — Z87891 Personal history of nicotine dependence: Secondary | ICD-10-CM | POA: Insufficient documentation

## 2020-05-26 DIAGNOSIS — O2392 Unspecified genitourinary tract infection in pregnancy, second trimester: Secondary | ICD-10-CM | POA: Insufficient documentation

## 2020-05-26 DIAGNOSIS — N898 Other specified noninflammatory disorders of vagina: Secondary | ICD-10-CM | POA: Insufficient documentation

## 2020-05-26 DIAGNOSIS — O99891 Other specified diseases and conditions complicating pregnancy: Secondary | ICD-10-CM

## 2020-05-26 DIAGNOSIS — O3462 Maternal care for abnormality of vagina, second trimester: Secondary | ICD-10-CM | POA: Diagnosis present

## 2020-05-26 DIAGNOSIS — Z3A26 26 weeks gestation of pregnancy: Secondary | ICD-10-CM | POA: Insufficient documentation

## 2020-05-26 DIAGNOSIS — Z7982 Long term (current) use of aspirin: Secondary | ICD-10-CM | POA: Diagnosis not present

## 2020-05-26 DIAGNOSIS — R8271 Bacteriuria: Secondary | ICD-10-CM | POA: Insufficient documentation

## 2020-05-26 LAB — URINALYSIS, ROUTINE W REFLEX MICROSCOPIC
Bilirubin Urine: NEGATIVE
Glucose, UA: NEGATIVE mg/dL
Hgb urine dipstick: NEGATIVE
Ketones, ur: 80 mg/dL — AB
Nitrite: NEGATIVE
Protein, ur: NEGATIVE mg/dL
Specific Gravity, Urine: 1.02 (ref 1.005–1.030)
pH: 7 (ref 5.0–8.0)

## 2020-05-26 MED ORDER — CEPHALEXIN 500 MG PO CAPS: 500.0000 mg | ORAL_CAPSULE | Freq: Four times a day (QID) | ORAL | 0 refills | Status: AC

## 2020-05-26 NOTE — Discharge Instructions (Addendum)
You have been seen and discharged from the emergency department.  Establish care with an OB/GYN for reevaluation and further care. Take home medications as prescribed. If you have any worsening symptoms, vaginal pain, abnormal vaginal discharge/bleeding or further concerns for health please return to an emergency department for further evaluation.
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
## 2020-05-26 NOTE — Telephone Encounter (Signed)
Patient called saying she has had 3 contractions this morning since 0800 and they make her feel "like I'm going to pass out". She also states she feels she is having vaginal leaking, clear fluid. Patient counseled to go to the ED now to be evaluated for PTL and rupture of membranes. Patient states understanding.Marland KitchenMarland KitchenBurt Knack, RN

## 2020-05-26 NOTE — ED Triage Notes (Signed)
Pt c/o decreased fetal movement, vaginal pressure, and leaking fluid from vagina that started at 8am this morning.

## 2020-05-26 NOTE — Progress Notes (Signed)
G2P1 at 27 4/7 weeks reports to [facility] with c/o leaking and vaginal pressure.  Monitors applied per APRN.  Notified of arrival and symptoms.  Hx of UTI, +UDS this pregnancy.  Receiving [facility] at [facility]. Requested EDMD to do SSE to look for pooling and if they could perform a fern test or amniosure to confirm SROM.

## 2020-05-26 NOTE — Progress Notes (Signed)
Patient to be D/C home.  Will make appt with [facility] to [facility].  Given abx for UTI.  Cleared by OB Service.  Dr Crissie Reese updated on pt status.

## 2020-05-28 ENCOUNTER — Telehealth: Payer: Self-pay | Admitting: Family Medicine

## 2020-05-28 ENCOUNTER — Telehealth: Payer: Self-pay

## 2020-05-28 LAB — URINE CULTURE

## 2020-05-28 NOTE — Telephone Encounter (Signed)
Call to client who reports thinks has yeast infection due to antibiotic taking for UTI prescribed at recent [facility] evaluation. Reports vagina is "red, swollen and really itchy". Offered appt this pm, but declined due to son has sick appt with MD this pm. Appt scheduled for tomorrow am with arrival time of 0800. Client is aware she will be worked in. Jossie Ng, RN

## 2020-05-28 NOTE — Telephone Encounter (Signed)
Opened in error. Kentley Cedillo, RN  

## 2020-05-28 NOTE — Telephone Encounter (Signed)
Please call me asap have something not sure if its high priority

## 2020-05-29 ENCOUNTER — Other Ambulatory Visit: Payer: Self-pay

## 2020-05-29 ENCOUNTER — Ambulatory Visit: Payer: Medicaid Other | Admitting: Physician Assistant

## 2020-05-29 ENCOUNTER — Encounter: Payer: Self-pay | Admitting: Physician Assistant

## 2020-05-29 VITALS — BP 113/77 | HR 106 | Temp 98.0°F | Wt 224.0 lb

## 2020-05-29 DIAGNOSIS — O0992 Supervision of high risk pregnancy, unspecified, second trimester: Secondary | ICD-10-CM

## 2020-05-29 LAB — WET PREP FOR TRICH, YEAST, CLUE
Trichomonas Exam: NEGATIVE
Yeast Exam: NEGATIVE

## 2020-05-29 MED ORDER — CLOTRIMAZOLE 1 % VA CREA: 1.0000 | TOPICAL_CREAM | Freq: Every day | VAGINAL | 0 refills | Status: DC

## 2020-05-29 NOTE — Progress Notes (Signed)
Presents for evaluation of possible vaginal yeast infection that client thinks started as result of antibiotic started 4/11/202 for UTI (prescribed at [facility] ED). Unable to stay for 28 week labs today and aware of previously scheduled [facility] RV appt 06/19/2020. Jossie Ng, RN  Wet mount negative and reviewed by A. Streilein PA-C. Per her verbal order, dispense Cliotrimazole 1% vaginal cream - one applicator full qhs x7. Jossie Ng, RN
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
## 2020-05-31 LAB — URINE CULTURE: Organism ID, Bacteria: NO GROWTH

## 2020-06-18 ENCOUNTER — Ambulatory Visit: Payer: Self-pay

## 2020-06-18 ENCOUNTER — Telehealth: Payer: Self-pay

## 2020-06-18 ENCOUNTER — Other Ambulatory Visit: Payer: Self-pay

## 2020-06-18 ENCOUNTER — Ambulatory Visit: Payer: Medicaid Other | Admitting: Family Medicine

## 2020-06-18 VITALS — BP 129/79 | HR 100 | Temp 97.3°F | Wt 226.4 lb

## 2020-06-18 DIAGNOSIS — Z23 Encounter for immunization: Secondary | ICD-10-CM | POA: Diagnosis not present

## 2020-06-18 DIAGNOSIS — B373 Candidiasis of vulva and vagina: Secondary | ICD-10-CM

## 2020-06-18 DIAGNOSIS — O0993 Supervision of high risk pregnancy, unspecified, third trimester: Secondary | ICD-10-CM | POA: Diagnosis not present

## 2020-06-18 DIAGNOSIS — O0992 Supervision of high risk pregnancy, unspecified, second trimester: Secondary | ICD-10-CM

## 2020-06-18 DIAGNOSIS — B3731 Acute candidiasis of vulva and vagina: Secondary | ICD-10-CM

## 2020-06-18 LAB — URINALYSIS
Bilirubin, UA: NEGATIVE
Glucose, UA: NEGATIVE
Ketones, UA: NEGATIVE
Nitrite, UA: NEGATIVE
Protein,UA: NEGATIVE
RBC, UA: NEGATIVE
Specific Gravity, UA: 1.025 (ref 1.005–1.030)
Urobilinogen, Ur: 0.2 mg/dL (ref 0.2–1.0)
pH, UA: 7 (ref 5.0–7.5)

## 2020-06-18 LAB — WET PREP FOR TRICH, YEAST, CLUE: Trichomonas Exam: NEGATIVE

## 2020-06-18 MED ORDER — MICONAZOLE NITRATE 2 % VA CREA: 1.0000 | TOPICAL_CREAM | Freq: Every day | VAGINAL | 0 refills | Status: AC

## 2020-06-18 MED ORDER — MICONAZOLE NITRATE 2 % VA CREA: 1.0000 | TOPICAL_CREAM | Freq: Every day | VAGINAL | Status: DC

## 2020-06-18 NOTE — Progress Notes (Signed)
Due to transportation issue, client unable to stay for 28 week labs / Rhophylac. Counseled on importance of obtaining labs ASAP and appt scheduled for 06/24/2020 per client request ([facility]). Appt reminder card given. Client aware to schedule next [facility] RV appt at clerical window. Counseled on recommendations for Tdap in pregnancy and for those who will spend any amount of time around infant. Client tolerated Tdap without complaint. Jossie Ng, RN Per Elveria Rising FNP-BC, cancel Nurse Clinic appt 06/24/2020 as client needs follow-up appt that day in [facility]. Due to schedule, appt is same time as client's arrival time (2:45 pm). Jossie Ng, RN

## 2020-06-18 NOTE — Telephone Encounter (Signed)
Call to client to counsel her that she will be a work-in this pm since needs to arrive later than previously discussed 1:45 pm (transportation issues). Client verbalized understanding. Jossie Ng, RN

## 2020-06-18 NOTE — Telephone Encounter (Signed)
Call from client who reports recurrence of vaginal itching and irritation that is "10 times worse than before." 05/29/2020 wet mount negative, but treated for yeast with Clotrimazole 1% qhs x7. Client reports started cream 05/29/2020 and used x7 nights with resolution of symptoms. States lost mucus plug on Friday (06/13/2020) and shortly after symptoms of irritation and itching recurred, but worse than before. [facility] RV scheduled for 06/19/2020 rescheduled for today with arrival time of 1345. Jossie Ng, RN

## 2020-06-18 NOTE — Telephone Encounter (Signed)
Per new note on appt line, client called and will arrive around 2:30 pm due to transportation issues. Appt reschedule in computer. Jossie Ng, RN

## 2020-06-19 ENCOUNTER — Ambulatory Visit: Payer: Self-pay

## 2020-06-20 LAB — CHLAMYDIA/GC NAA, CONFIRMATION
Chlamydia trachomatis, NAA: NEGATIVE
Neisseria gonorrhoeae, NAA: NEGATIVE
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
## 2020-06-23 ENCOUNTER — Other Ambulatory Visit: Payer: Self-pay | Admitting: Advanced Practice Midwife

## 2020-06-23 DIAGNOSIS — O99212 Obesity complicating pregnancy, second trimester: Secondary | ICD-10-CM

## 2020-06-24 ENCOUNTER — Other Ambulatory Visit: Payer: Self-pay

## 2020-06-26 ENCOUNTER — Ambulatory Visit: Payer: Medicaid Other | Attending: Obstetrics

## 2020-06-26 ENCOUNTER — Telehealth: Payer: Self-pay

## 2020-06-26 ENCOUNTER — Other Ambulatory Visit: Payer: Self-pay

## 2020-06-26 ENCOUNTER — Other Ambulatory Visit: Payer: Self-pay | Admitting: Advanced Practice Midwife

## 2020-06-26 DIAGNOSIS — O350XX0 Maternal care for (suspected) central nervous system malformation in fetus, not applicable or unspecified: Secondary | ICD-10-CM

## 2020-06-26 DIAGNOSIS — Z3A27 27 weeks gestation of pregnancy: Secondary | ICD-10-CM | POA: Insufficient documentation

## 2020-06-26 DIAGNOSIS — O99212 Obesity complicating pregnancy, second trimester: Secondary | ICD-10-CM

## 2020-06-26 DIAGNOSIS — E669 Obesity, unspecified: Secondary | ICD-10-CM | POA: Insufficient documentation

## 2020-06-26 NOTE — Telephone Encounter (Signed)
[facility] in [facility] 06/26/2020. Call to client and left message requesting she reschedule missed [facility] RV appt. Number to call provided. Jossie Ng, RN

## 2020-06-27 ENCOUNTER — Encounter: Payer: Self-pay | Admitting: Advanced Practice Midwife

## 2020-06-27 NOTE — Telephone Encounter (Signed)
Call to client to reschedule missed [facility] appt on 06/24/2020. Left message requesting she reschedule appt and that 28 week labs and Rhophylac injection due to negative blood type are past due. Number to call provided. Jossie Ng, RN

## 2020-06-30 NOTE — Telephone Encounter (Signed)
Left message on voicemail requesting client reschedule missed [facility] RV. Number to call provided. Call to grandmother (emergency contact and female answering phone states name different from grandmother. Call to grandfather (emergency contact) and requesting assistance contacting client to schedule appt in maternity clinic. Jossie Ng, RN

## 2020-07-01 NOTE — Telephone Encounter (Signed)
[facility] 06/24/2020 for follow-up as requested by provider. Per client, no longer having any vaginal symptoms and problem has completely resolved. As 28 week labs and Rhophylac due, [facility] RV appt scheduled for 07/04/2020 per

## 2020-07-01 NOTE — Telephone Encounter (Signed)
Inadvertently closed note prior to completion. Client requested appt Friday pm. Appt scheduled and to arrive at 1:45 pm. Jossie Ng, RN

## 2020-07-02 LAB — 789231 7+OXYCODONE-BUND
Amphetamines, Urine: NEGATIVE ng/mL
BENZODIAZ UR QL: NEGATIVE ng/mL
Barbiturate screen, urine: NEGATIVE ng/mL
Cocaine (Metab.): NEGATIVE ng/mL
OPIATE SCREEN URINE: NEGATIVE ng/mL
Oxycodone/Oxymorphone, Urine: NEGATIVE ng/mL
PCP Quant, Ur: NEGATIVE ng/mL

## 2020-07-02 LAB — CANNABINOID CONFIRMATION, UR
CANNABINOIDS: POSITIVE — AB
Carboxy THC GC/MS Conf: 31 ng/mL

## 2020-07-03 ENCOUNTER — Telehealth: Payer: Self-pay

## 2020-07-03 NOTE — Telephone Encounter (Signed)
TC to patient to reschedule her [facility] RV appointment from 07/04/2020 to a time next week due to lack of provider availability. LM with number to call.Burt Knack, RN

## 2020-07-03 NOTE — Telephone Encounter (Signed)
Call to client to reschedule [facility] RV appt on 07/04/2020 due to provider availability. Left message to call regarding appt and number to call provided. Jossie Ng, RN

## 2020-07-04 ENCOUNTER — Ambulatory Visit: Payer: Self-pay

## 2020-07-04 NOTE — Telephone Encounter (Signed)
Call to client to reschedule 07/04/2020 appt due to provider availability and per recorded message, voicemail box is full. Call to grandfather (emergency contact) and left message requesting assistance contacting client to reschedule 07/04/20 appt and number to call provided. Jossie Ng, RN

## 2020-07-07 ENCOUNTER — Observation Stay
Admission: EM | Admit: 2020-07-07 | Discharge: 2020-07-07 | Disposition: A | Discharge status: Home / Self Care | Payer: Medicaid Other | Attending: Obstetrics & Gynecology | Admitting: Obstetrics & Gynecology

## 2020-07-07 ENCOUNTER — Telehealth: Payer: Self-pay

## 2020-07-07 ENCOUNTER — Encounter: Payer: Self-pay | Admitting: Obstetrics & Gynecology

## 2020-07-07 ENCOUNTER — Other Ambulatory Visit: Payer: Self-pay

## 2020-07-07 ENCOUNTER — Other Ambulatory Visit: Payer: Self-pay | Admitting: Obstetrics

## 2020-07-07 DIAGNOSIS — Z87891 Personal history of nicotine dependence: Secondary | ICD-10-CM | POA: Diagnosis not present

## 2020-07-07 DIAGNOSIS — M545 Low back pain, unspecified: Secondary | ICD-10-CM | POA: Diagnosis not present

## 2020-07-07 DIAGNOSIS — O99891 Other specified diseases and conditions complicating pregnancy: Secondary | ICD-10-CM | POA: Insufficient documentation

## 2020-07-07 DIAGNOSIS — O368130 Decreased fetal movements, third trimester, not applicable or unspecified: Secondary | ICD-10-CM | POA: Diagnosis present

## 2020-07-07 DIAGNOSIS — O368190 Decreased fetal movements, unspecified trimester, not applicable or unspecified: Secondary | ICD-10-CM | POA: Diagnosis present

## 2020-07-07 DIAGNOSIS — Z7982 Long term (current) use of aspirin: Secondary | ICD-10-CM | POA: Diagnosis not present

## 2020-07-07 DIAGNOSIS — O99210 Obesity complicating pregnancy, unspecified trimester: Secondary | ICD-10-CM

## 2020-07-07 DIAGNOSIS — Z3A33 33 weeks gestation of pregnancy: Secondary | ICD-10-CM | POA: Insufficient documentation

## 2020-07-07 DIAGNOSIS — O0992 Supervision of high risk pregnancy, unspecified, second trimester: Secondary | ICD-10-CM

## 2020-07-07 DIAGNOSIS — B373 Candidiasis of vulva and vagina: Secondary | ICD-10-CM

## 2020-07-07 DIAGNOSIS — R8271 Bacteriuria: Secondary | ICD-10-CM

## 2020-07-07 DIAGNOSIS — O26899 Other specified pregnancy related conditions, unspecified trimester: Secondary | ICD-10-CM

## 2020-07-07 DIAGNOSIS — B3731 Acute candidiasis of vulva and vagina: Secondary | ICD-10-CM

## 2020-07-07 DIAGNOSIS — Z6791 Unspecified blood type, Rh negative: Secondary | ICD-10-CM

## 2020-07-07 LAB — CBC
HCT: 31 % — ABNORMAL LOW (ref 36.0–46.0)
Hemoglobin: 11 g/dL — ABNORMAL LOW (ref 12.0–15.0)
MCH: 32.4 pg (ref 26.0–34.0)
MCHC: 35.5 g/dL (ref 30.0–36.0)
MCV: 91.4 fL (ref 80.0–100.0)
Platelets: 163 10*3/uL (ref 150–400)
RBC: 3.39 MIL/uL — ABNORMAL LOW (ref 3.87–5.11)
RDW: 12.9 % (ref 11.5–15.5)
WBC: 12.4 10*3/uL — ABNORMAL HIGH (ref 4.0–10.5)
nRBC: 0 % (ref 0.0–0.2)

## 2020-07-07 LAB — URINALYSIS, COMPLETE (UACMP) WITH MICROSCOPIC
Bilirubin Urine: NEGATIVE
Glucose, UA: NEGATIVE mg/dL
Hgb urine dipstick: NEGATIVE
Ketones, ur: 5 mg/dL — AB
Nitrite: NEGATIVE
Protein, ur: NEGATIVE mg/dL
Specific Gravity, Urine: 1.012 (ref 1.005–1.030)
pH: 7 (ref 5.0–8.0)

## 2020-07-07 MED ORDER — ACETAMINOPHEN 325 MG PO TABS
650.0000 mg | ORAL_TABLET | Freq: Four times a day (QID) | ORAL | Status: DC | PRN
  Administered 2020-07-07: 650 mg via ORAL
  Filled 2020-07-07: qty 2

## 2020-07-07 MED ORDER — NITROFURANTOIN MONOHYD MACRO 100 MG PO CAPS: 100.0000 mg | ORAL_CAPSULE | Freq: Every day | ORAL | 5 refills | Status: DC

## 2020-07-07 MED ORDER — CYCLOBENZAPRINE HCL 5 MG PO TABS
5.0000 mg | ORAL_TABLET | Freq: Once | ORAL | Status: AC
  Administered 2020-07-07: 5 mg via ORAL
  Filled 2020-07-07: qty 1

## 2020-07-07 MED ORDER — FLUCONAZOLE 150 MG PO TABS: 150.0000 mg | ORAL_TABLET | Freq: Once | ORAL | 0 refills | Status: AC

## 2020-07-07 NOTE — Final Progress Note (Signed)
Final Progress Note  Patient ID: Sandy Cox MRN: [id number] DOB/AGE: 2000-09-02 20 y.o.  Admit date: 07/07/2020 Admitting provider: Nadara Mustard, MD Discharge date: 07/07/2020   Admission Diagnoses: Decreased fetal movement at [redacted] weeks gestation Back pain  Discharge Diagnoses:  Active Problems:    Reactive fetal heart tones Lower back pain in pregnancy  History of Present Illness: The patient is a 20 y.o. female G2P1001 at [redacted]w[redacted]d who presents for evaluation of some decreased fetal movement this morning.. She has not eaten since last evening. Is also c/o lower back pain which did not response to Tylenol. Her mother is with her and is supportive. Per the patient report, she has had several UTIs this pregnancy. She denies any contractions or leaking fo fluid or vaginal bleeding. She also denies any dysurai or urinary urgency. She has an appointment with the health Department for her next OB visit in two days. She has not had her 28 week labs yet.  Past Medical History:  Diagnosis Date  . Anemia   . Depression   . Dyspnea   . Headache    sinus  . History of bipolar disorder   . History of depression   . History of dizziness   . History of nausea   . History of shortness of breath   . History of weight loss   . Migraine   . Obesity   . Ovarian cyst   . Right calf pain   . Uterine endometriosis    also cysts/ulcers per mother  . Vaginal bleeding     Past Surgical History:  Procedure Laterality Date  . TONSILLECTOMY     T & A 2017  . TONSILLECTOMY AND ADENOIDECTOMY N/A 02/12/2015   Procedure: TONSILLECTOMY AND ADENOIDECTOMY;  Surgeon: Bud Face, MD;  Location: [facility];  Service: ENT;  Laterality: N/A;  . WISDOM TOOTH EXTRACTION      No current facility-administered medications on file prior to encounter.   Current Outpatient Medications on File Prior to Encounter  Medication Sig Dispense Refill  . aspirin EC 81 MG tablet Take 1 tablet (81 mg  total) by mouth daily. Swallow whole.    . Prenatal Vit-Fe Fumarate-FA (PRENATAL MULTIVITAMIN) TABS tablet Take 1 tablet by mouth daily at 12 noon.      No Known Allergies  Social History   Socioeconomic History  . Marital status: Media planner    Spouse name: Genella Rife  . Number of children: 1  . Years of education: 65 (quit in 11th grade)  . Highest education level: 10th grade  Occupational History  . Occupation: Grill Cook/Waitress  Tobacco Use  . Smoking status: Former Smoker    Packs/day: 1.00    Years: 6.00    Pack years: 6.00    Types: Cigarettes  . Smokeless tobacco: Never Used  . Tobacco comment: Denies secondhand cigarette exposure.  Vaping Use  . Vaping Use: Never used  Substance and Sexual Activity  . Alcohol use: Not Currently    Comment: Last ETOH use 09/21/2019  . Drug use: Not Currently    Types: Marijuana    Comment: marijuana (last use 01/14/2020) heroin (last use at least 5 years ago), Xanax (last use at least 5 years ago), oxycodone (last use at least 5 years ago)  . Sexual activity: Yes    Partners: Male    Birth control/protection: None    Comment: Last BCM = Nexplanon (removed 6 - 8 months ago as pregnancy desired)  Other  Topics Concern  . Not on file  Social History Narrative  . Not on file   Social Determinants of Health   Financial Resource Strain: Low Risk   . Difficulty of Paying Living Expenses: Not very hard  Food Insecurity: No Food Insecurity  . Worried About Programme researcher, broadcasting/film/video in the Last Year: Never true  . Ran Out of Food in the Last Year: Never true  Transportation Needs: No Transportation Needs  . Lack of Transportation (Medical): No  . Lack of Transportation (Non-Medical): No  Physical Activity: Not on file  Stress: Not on file  Social Connections: Not on file  Intimate Partner Violence: Not At Risk  . Fear of Current or Ex-Partner: No  . Emotionally Abused: No  . Physically Abused: No  . Sexually Abused: No     Family History  Problem Relation Age of Onset  . Cancer Maternal Grandmother   . Migraines Maternal Grandmother   . Depression Maternal Grandmother   . Cancer Maternal Grandfather   . Hypertension Maternal Grandfather   . Heart disease Maternal Grandfather   . Migraines Maternal Grandfather   . Migraines Mother   . Bipolar disorder Mother   . Multiple births Mother   . Migraines Brother   . Bipolar disorder Brother   . Migraines Sister   . Bipolar disorder Sister   . Torticollis Son      ROS   Physical Exam: BP 110/75 (BP Location: Left Arm)   Pulse (!) 101   Temp 98 F (36.7 C) (Oral)   Resp 18   Ht (S) 5\' 5"  (1.651 m)   Wt 102.5 kg   LMP 11/08/2019   BMI 37.61 kg/m   OBGyn Exam  Consults: None  Significant Findings/ Diagnostic Studies: labs:  Lab Orders     Urine Culture     CBC     Urinalysis, Complete w Microscopic  Ua shows trace ketone, negative protein, negative nitirites, trace leukocytes and rare bacteria Specific gravity: 1.012. glucose negative CBC --Hgb 11.0, Hct 31; platelets 163,000  Procedures: EFM NST Baseline FHR: 135 baseline beats/min Variability: moderate Accelerations: present Decelerations: absent Tocometry: no contractions noted or plapated  Interpretation:  INDICATIONS: decreased fetal movement RESULTS:  A NST procedure was performed with FHR monitoring and a normal baseline established, appropriate time of 20-40 minutes of evaluation, and accels >2 seen w 15x15 characteristics.  Results show a REACTIVE NST.    Hospital Course: The patient was admitted to Labor and Delivery Triage for observation. She was placed on the monitor for an NST. A clean catch urine sample was sent for analysis. Her NST was reactive, and she showed no signs of contractions. Her complaint of lower back pain was addressed with a dose of Flexeril and tylenol,. which she reports, alleviated her back pain. Shortly after her arrival, she reported feeling  fetal movement. As she has had two UTIs this pregnancy, a prescription for Macrobid was sent to her pharmacy wile we await the results of her urine culture.she plans to follow up with her OB providers at the [facility] in two days.  Discharge Condition: good  Disposition: Discharge disposition: 01-Home or Self Care      Home in care of family Diet: Regular diet  Discharge Activity: Activity as tolerated  Discharge Instructions    Discharge activity:  No Restrictions   Complete by: As directed    Discharge diet:  No restrictions   Complete by: As directed    No sexual  activity restrictions   Complete by: As directed    Notify physician for a general feeling that "something is not right"   Complete by: As directed    Notify physician for increase or change in vaginal discharge   Complete by: As directed    Notify physician for intestinal cramps, with or without diarrhea, sometimes described as "gas pain"   Complete by: As directed    Notify physician for leaking of fluid   Complete by: As directed    Notify physician for low, dull backache, unrelieved by heat or Tylenol   Complete by: As directed    Notify physician for menstrual like cramps   Complete by: As directed    Notify physician for pelvic pressure   Complete by: As directed    Notify physician for uterine contractions.  These may be painless and feel like the uterus is tightening or the baby is  "balling up"   Complete by: As directed    Notify physician for vaginal bleeding   Complete by: As directed    PRETERM LABOR:  Includes any of the follwing symptoms that occur between 20 - [redacted] weeks gestation.  If these symptoms are not stopped, preterm labor can result in preterm delivery, placing your baby at risk   Complete by: As directed      Allergies as of 07/07/2020   No Known Allergies     Medication List    TAKE these medications   aspirin EC 81 MG tablet Take 1 tablet (81 mg total) by mouth daily. Swallow whole.    prenatal multivitamin Tabs tablet Take 1 tablet by mouth daily at 12 noon.      a prescirption for macrobid and another for diflucan are sent to her pharmacy.  Total time spent taking care of this patient: 40 minutes.  Signed: Mirna Mires, CNM  07/07/2020, 4:21 PM

## 2020-07-07 NOTE — Telephone Encounter (Signed)
Per Epic appt desk, client has rescheduled appt for 07/09/2020. Jossie Ng, RN

## 2020-07-07 NOTE — Progress Notes (Signed)
Patient states that her due date is 08/21/2020. Updated in system per patient. [birth year] patient's [birth year].

## 2020-07-07 NOTE — Telephone Encounter (Signed)
Call to client to reschedule missed [facility] appt and left message to call with number provided. Call to grandfather (emergency contact) who states gave her the message when RN called the other day. States he will test her now to schedule appt. Jossie Ng, RN

## 2020-07-07 NOTE — OB Triage Note (Signed)
Patient is G2P1, [redacted]w[redacted]d is here for DFM, states that she has not felt baby move in 2 days. Monitors applied and assessing, Fetal heart rate found and 140 bpm. Patient also states that she is having a lot of lower bilateral back pain that started 2 days ago as well, she rates that a 7/10. Tylenol taken per patient and it did not help with the pain. Patient states that she has pressure and pain in her vaginal area as well. The pain will come and go. Denies LOF or Bleeding. VSS

## 2020-07-07 NOTE — Discharge Summary (Signed)
Please see Final Progress Note.  Mirna Mires, CNM  07/07/2020 4:23 PM

## 2020-07-07 NOTE — Progress Notes (Signed)
Reviewed discharge instructions with patient and her mother. Patient verbalizes understanding and does not have any questions/concerns. Patient is ambulatory home with mother.

## 2020-07-07 NOTE — Telephone Encounter (Signed)
Erroneous encounter. Timeka Goette, RN  

## 2020-07-08 ENCOUNTER — Encounter: Payer: Self-pay | Admitting: Physician Assistant

## 2020-07-08 LAB — URINE CULTURE: Culture: <10000 — AB

## 2020-07-09 ENCOUNTER — Ambulatory Visit: Payer: Medicaid Other

## 2020-07-09 ENCOUNTER — Telehealth: Payer: Self-pay | Admitting: Family Medicine

## 2020-07-09 ENCOUNTER — Telehealth: Payer: Self-pay

## 2020-07-09 NOTE — Telephone Encounter (Signed)
Returned client's call and [facility] appt rescheduled for tomorrow am. Client verbalized understanding that she is a work-in and we will see her as quickly as possible at appt (due for 28 week labs and Rhophylac). Jossie Ng, RN

## 2020-07-09 NOTE — Telephone Encounter (Signed)
Please refer to phone encounter opened earlier this am. Jossie Ng, RN

## 2020-07-09 NOTE — Telephone Encounter (Signed)
Called to rescheduled. No answer and VM box is full. Then contacted emergency contact, was able to LVM.   Would like to reschedule patient soon due to rhogam. Would like to double book her for this Friday.   Floy Sabina, RN

## 2020-07-09 NOTE — Telephone Encounter (Signed)
Patient was supposed to be seen for her 28wk. Lab and she is almost 34wk now. She was seen at the hospital two days ago and has been rescheduled several times due to [facility] Provider. Please give a call back ASAP.

## 2020-07-09 NOTE — Telephone Encounter (Signed)
Verifed with Karena Addison in clerical that this is a duplicate message. Jossie Ng, RN

## 2020-07-09 NOTE — Telephone Encounter (Signed)
Patient was supposed to be seen for her 28wk. Lab and she is almost 34wk now. She was seen at the hospital two days ago and has been rescheduled several times due to [facility] Provider. She is very concerned and wants someone to please give a call back ASAP.

## 2020-07-10 ENCOUNTER — Telehealth: Payer: Self-pay

## 2020-07-10 ENCOUNTER — Ambulatory Visit: Payer: Self-pay

## 2020-07-10 ENCOUNTER — Encounter: Payer: Self-pay | Admitting: Family Medicine

## 2020-07-10 NOTE — Progress Notes (Signed)
EDD changed per [facility] U/S to reflect early U/S for dating.    Wendi Snipes, FNP

## 2020-07-10 NOTE — Telephone Encounter (Signed)
[facility] as scheduled this am in [facility]. Call to client and voicemail box remains full and unable to leave message. Call to emergency contact and grandmother answered phone. Per grandmother, she will have client call us to reschedule appt. Grandmother also states client has dental appt later today regarding broken teeth. Jossie Ng, RN

## 2020-07-16 ENCOUNTER — Other Ambulatory Visit: Payer: Self-pay

## 2020-07-16 ENCOUNTER — Ambulatory Visit: Payer: Medicaid Other | Admitting: Advanced Practice Midwife

## 2020-07-16 VITALS — BP 115/67 | HR 104 | Temp 98.7°F | Wt 233.6 lb

## 2020-07-16 DIAGNOSIS — Z6791 Unspecified blood type, Rh negative: Secondary | ICD-10-CM | POA: Diagnosis not present

## 2020-07-16 DIAGNOSIS — O99213 Obesity complicating pregnancy, third trimester: Secondary | ICD-10-CM

## 2020-07-16 DIAGNOSIS — O26893 Other specified pregnancy related conditions, third trimester: Secondary | ICD-10-CM

## 2020-07-16 DIAGNOSIS — O2341 Unspecified infection of urinary tract in pregnancy, first trimester: Secondary | ICD-10-CM

## 2020-07-16 DIAGNOSIS — O99210 Obesity complicating pregnancy, unspecified trimester: Secondary | ICD-10-CM

## 2020-07-16 DIAGNOSIS — O0993 Supervision of high risk pregnancy, unspecified, third trimester: Secondary | ICD-10-CM

## 2020-07-16 DIAGNOSIS — O2343 Unspecified infection of urinary tract in pregnancy, third trimester: Secondary | ICD-10-CM

## 2020-07-16 DIAGNOSIS — O0992 Supervision of high risk pregnancy, unspecified, second trimester: Secondary | ICD-10-CM

## 2020-07-16 DIAGNOSIS — O26899 Other specified pregnancy related conditions, unspecified trimester: Secondary | ICD-10-CM

## 2020-07-16 LAB — HEMOGLOBIN, FINGERSTICK: Hemoglobin: 11.3 g/dL (ref 11.1–15.9)

## 2020-07-16 MED ORDER — RHO D IMMUNE GLOBULIN 1500 UNIT/2ML IJ SOSY
300.0000 ug | PREFILLED_SYRINGE | Freq: Once | INTRAMUSCULAR | Status: AC
  Administered 2020-07-16: 300 ug via INTRAMUSCULAR

## 2020-07-16 NOTE — Progress Notes (Signed)
Reminder card given for appointment with [facility] on 08/07/20 at 0800.   Dental List in [facility] given to patient.   Rhophylac given today and patient info card given. Patient tolerated injection well.   Hgb reviewed: Reviewed and WNL.   Appointment made for 07/30/20 with arrival time of 1300. Patient appointment card given.   Floy Sabina, RN
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
## 2020-07-17 LAB — ANTIBODY SCREEN: Antibody Screen: NEGATIVE

## 2020-07-17 LAB — GLUCOSE, 1 HOUR GESTATIONAL: Gestational Diabetes Screen: 105 mg/dL (ref 65–139)

## 2020-07-17 LAB — HIV-1/HIV-2 QUALITATIVE RNA
HIV-1 RNA, Qualitative: NONREACTIVE
HIV-2 RNA, Qualitative: NONREACTIVE

## 2020-07-17 LAB — RPR: RPR Ser Ql: NONREACTIVE

## 2020-07-18 LAB — URINE CULTURE: Organism ID, Bacteria: NO GROWTH

## 2020-07-29 NOTE — Addendum Note (Signed)
Addended by: Heywood Bene on: 07/29/2020 12:06 PM   Modules accepted: Orders

## 2020-07-30 ENCOUNTER — Telehealth: Payer: Self-pay

## 2020-07-30 ENCOUNTER — Observation Stay
Admission: EM | Admit: 2020-07-30 | Discharge: 2020-07-30 | Disposition: A | Discharge status: Home / Self Care | Payer: Medicaid Other | Attending: Obstetrics & Gynecology | Admitting: Obstetrics & Gynecology

## 2020-07-30 ENCOUNTER — Encounter: Payer: Self-pay | Admitting: Obstetrics & Gynecology

## 2020-07-30 ENCOUNTER — Other Ambulatory Visit: Payer: Self-pay

## 2020-07-30 ENCOUNTER — Ambulatory Visit: Payer: Medicaid Other

## 2020-07-30 DIAGNOSIS — O0992 Supervision of high risk pregnancy, unspecified, second trimester: Secondary | ICD-10-CM

## 2020-07-30 DIAGNOSIS — Z3A32 32 weeks gestation of pregnancy: Secondary | ICD-10-CM

## 2020-07-30 DIAGNOSIS — N898 Other specified noninflammatory disorders of vagina: Secondary | ICD-10-CM | POA: Diagnosis not present

## 2020-07-30 DIAGNOSIS — O26893 Other specified pregnancy related conditions, third trimester: Principal | ICD-10-CM

## 2020-07-30 DIAGNOSIS — Z87891 Personal history of nicotine dependence: Secondary | ICD-10-CM | POA: Diagnosis not present

## 2020-07-30 DIAGNOSIS — O23593 Infection of other part of genital tract in pregnancy, third trimester: Secondary | ICD-10-CM | POA: Insufficient documentation

## 2020-07-30 DIAGNOSIS — Z7982 Long term (current) use of aspirin: Secondary | ICD-10-CM | POA: Insufficient documentation

## 2020-07-30 DIAGNOSIS — O368930 Maternal care for other specified fetal problems, third trimester, not applicable or unspecified: Secondary | ICD-10-CM | POA: Insufficient documentation

## 2020-07-30 DIAGNOSIS — O99210 Obesity complicating pregnancy, unspecified trimester: Secondary | ICD-10-CM

## 2020-07-30 DIAGNOSIS — B373 Candidiasis of vulva and vagina: Secondary | ICD-10-CM

## 2020-07-30 DIAGNOSIS — Z6791 Unspecified blood type, Rh negative: Secondary | ICD-10-CM

## 2020-07-30 LAB — URINALYSIS, ROUTINE W REFLEX MICROSCOPIC
Bilirubin Urine: NEGATIVE
Glucose, UA: NEGATIVE mg/dL
Hgb urine dipstick: NEGATIVE
Ketones, ur: NEGATIVE mg/dL
Nitrite: NEGATIVE
Protein, ur: 30 mg/dL — AB
Specific Gravity, Urine: 1.019 (ref 1.005–1.030)
pH: 6 (ref 5.0–8.0)

## 2020-07-30 MED ORDER — FLUCONAZOLE 50 MG PO TABS
150.0000 mg | ORAL_TABLET | Freq: Once | ORAL | Status: AC
  Administered 2020-07-30: 150 mg via ORAL
  Filled 2020-07-30: qty 1

## 2020-07-30 NOTE — Final Progress Note (Signed)
Final Progress Note  Patient ID: Sandy Cox MRN: [id number] DOB/AGE: [birth year] 20 y.o.  Admit date: 07/30/2020 Admitting provider: Mirna Mires, CNM Discharge date: 07/30/2020   Admission Diagnoses: vaginal discharge in pregnancy, third trimester Lower abdominal pain in pregnancy  Discharge Diagnoses:  Active Problems:   Vaginal discharge in pregnancy in third trimester  Yeast vaginitis Reassuring fetal heart tones  History of Present Illness: The patient is a 20 y.o. female G2P1001 at [redacted]w[redacted]d who presents for evaluation of increased vaginal discharge and some rare and irregular contractions.[facility] and earlier received a call from the [facility] informing her that they needed to reschedule her appointment. She informed them that she had noticed some white vaginal discharge last evening and some irregular contractions. They instructed her to report to the hospital Labot and Delivery for evaluation. She has been on a suppressive antibiotic for recurrent UTIs. She denies any recent intercourse. This is her second pregnancy, and previously she delivered a macrosomic baby at 37 weeks. No history of preterm labor. Today, she denies any vaginal bleeding. She did not wear a pad when she arrive to the hospital.  Her baby has been moving well  Past Medical History:  Diagnosis Date   Anemia    Depression    Dyspnea    Headache    sinus   History of bipolar disorder    History of depression    History of dizziness    History of nausea    History of shortness of breath    History of weight loss    Migraine    Obesity    Ovarian cyst    Right calf pain    Uterine endometriosis    also cysts/ulcers per mother   Vaginal bleeding     Past Surgical History:  Procedure Laterality Date   TONSILLECTOMY     T & A 2017   TONSILLECTOMY AND ADENOIDECTOMY N/A 02/12/2015   Procedure: TONSILLECTOMY AND ADENOIDECTOMY;  Surgeon: Bud Face, MD;  Location: [facility];  Service: ENT;  Laterality: N/A;   WISDOM TOOTH EXTRACTION      No current facility-administered medications on file prior to encounter.   Current Outpatient Medications on File Prior to Encounter  Medication Sig Dispense Refill   aspirin EC 81 MG tablet Take 1 tablet (81 mg total) by mouth daily. Swallow whole.     nitrofurantoin, macrocrystal-monohydrate, (MACROBID) 100 MG capsule Take 1 capsule (100 mg total) by mouth at bedtime. 30 capsule 5   Prenatal Vit-Fe Fumarate-FA (PRENATAL MULTIVITAMIN) TABS tablet Take 1 tablet by mouth daily at 12 noon.      No Known Allergies  Social History   Socioeconomic History   Marital status: Media planner    Spouse name: Genella Rife   Number of children: 1   Years of education: 11 (quit in 11th grade)   Highest education level: 10th grade  Occupational History   Occupation: Grill Cook/Waitress  Tobacco Use   Smoking status: Former    Packs/day: 1.00    Years: 6.00    Pack years: 6.00    Types: Cigarettes   Smokeless tobacco: Never   Tobacco comments:    Denies secondhand cigarette exposure.  Vaping Use   Vaping Use: Never used  Substance and Sexual Activity   Alcohol use: Not Currently    Comment: Last ETOH use 09/21/2019   Drug use: Not Currently    Types: Marijuana  Comment: marijuana (last use 01/14/2020) heroin (last use at least 5 years ago), Xanax (last use at least 5 years ago), oxycodone (last use at least 5 years ago)   Sexual activity: Yes    Partners: Male    Birth control/protection: None    Comment: Last BCM = Nexplanon (removed 6 - 8 months ago as pregnancy desired)  Other Topics Concern   Not on file  Social History Narrative   Not on file   Social Determinants of Health   Financial Resource Strain: Low Risk    Difficulty of Paying Living Expenses: Not very hard  Food Insecurity: No Food Insecurity   Worried About Programme researcher, broadcasting/film/video in the Last Year: Never  true   Ran Out of Food in the Last Year: Never true  Transportation Needs: No Transportation Needs   Lack of Transportation (Medical): No   Lack of Transportation (Non-Medical): No  Physical Activity: Not on file  Stress: Not on file  Social Connections: Not on file  Intimate Partner Violence: Not At Risk   Fear of Current or Ex-Partner: No   Emotionally Abused: No   Physically Abused: No   Sexually Abused: No    Family History  Problem Relation Age of Onset   Cancer Maternal Grandmother    Migraines Maternal Grandmother    Depression Maternal Grandmother    Cancer Maternal Grandfather    Hypertension Maternal Grandfather    Heart disease Maternal Grandfather    Migraines Maternal Grandfather    Migraines Mother    Bipolar disorder Mother    Multiple births Mother    Migraines Brother    Bipolar disorder Brother    Migraines Sister    Bipolar disorder Sister    Torticollis Son      ROS   Physical Exam: BP 123/68   Pulse (!) 122   Temp 98 F (36.7 C) (Oral)   Resp 16   LMP 11/08/2019   OBGyn Exam  Consults: None  Significant Findings/ Diagnostic Studies: UA is pending  Procedures: NST NST Baseline FHR: 145 baseline  beats/min Variability: moderate Accelerations: present Decelerations: absent Tocometry: no contractions seen or palpated  Interpretation:  INDICATIONS: rule out uterine contractions RESULTS:  A NST procedure was performed with FHR monitoring and a normal baseline established, appropriate time of 20-40 minutes of evaluation, and accels >2 seen w 15x15 characteristics.  Results show a REACTIVE NST.    Hospital Course: The patient was admitted to Labor and Delivery Triage for observation. She was placed on the monitor. A UA was sent. Once the EFM demonstrated no regular contractions and reassuring FHTs, a spculum exam was perfomed- No pooling of fluid was seen. A moderate amount of white clumpy discharge was noted.  An SVE showed a closed cervix  that is not effaced. The fetal presenting partis "floating".With reassuring FHTs and no signs of PTL, she is discharged home after receiving one dose of Diflucan. She is advised to consume ample water and practice pelvic rest until her yeast vaginitis has cleared up. She will follow up at the [facility].  Discharge Condition: good  Disposition: Discharge disposition: 01-Home or Self Care       Diet: Regular diet  Discharge Activity: Activity as tolerated  Discharge Instructions     Discharge activity:  No Restrictions   Complete by: As directed    No sexual activity restrictions   Complete by: As directed    Notify physician for a general feeling that "something is not  right"   Complete by: As directed    Notify physician for increase or change in vaginal discharge   Complete by: As directed    Notify physician for intestinal cramps, with or without diarrhea, sometimes described as "gas pain"   Complete by: As directed    Notify physician for leaking of fluid   Complete by: As directed    Notify physician for low, dull backache, unrelieved by heat or Tylenol   Complete by: As directed    Notify physician for menstrual like cramps   Complete by: As directed    Notify physician for pelvic pressure   Complete by: As directed    Notify physician for uterine contractions.  These may be painless and feel like the uterus is tightening or the baby is  "balling up"   Complete by: As directed    Notify physician for vaginal bleeding   Complete by: As directed    PRETERM LABOR:  Includes any of the follwing symptoms that occur between 20 - [redacted] weeks gestation.  If these symptoms are not stopped, preterm labor can result in preterm delivery, placing your baby at risk   Complete by: As directed       Allergies as of 07/30/2020   No Known Allergies      Medication List     TAKE these medications    aspirin EC 81 MG tablet Take 1 tablet (81 mg total) by mouth daily. Swallow whole.    nitrofurantoin (macrocrystal-monohydrate) 100 MG capsule Commonly known as: Macrobid Take 1 capsule (100 mg total) by mouth at bedtime.   prenatal multivitamin Tabs tablet Take 1 tablet by mouth daily at 12 noon.         Total time spent taking care of this patient: 35 minutes  Signed: Mirna Mires, CNM  07/30/2020, 3:17 PM Vaginal discharge in pregnancy

## 2020-07-30 NOTE — Telephone Encounter (Signed)
RN called patient to rescheduled today's appointment due to provider being out sick.   Patient stated she needs to be seen soon due to having contractions since "last night every 45 minute and yeast symptoms".   Strongly advised patient to check-in at labor and delivery at [facility] due to contractions. Also advised to increase water intake.   [facility] RV rescheduled for Monday 6/20 at 2:40pm arrival time.   Patient agreed with plan of action and verbalized she will head out to L&D.   Floy Sabina, RN

## 2020-07-30 NOTE — Discharge Summary (Signed)
Please see Final progress note  Sandy Cox, CNM  07/30/2020 3:16 PM

## 2020-07-30 NOTE — OB Triage Note (Signed)
Patient is 19yo female G2P0 at [redacted]w[redacted]d. She presents to the unit with complaint of contractions every 30 minutes and leaking of fluid. Pt reports positive fetal movement and denies vaginal bleeding. Pt states that she has white vaginal discharge and itchiness with no odor. VS stable. Monitors applied and assessing.   CNM performed a cervical exam and speculum inspection that found closed cervix. Pt educated by CNM about bacterial yeast infections. Fluconazole given per CNM order (see MAR). Pt is educated about Braxton Hick's contractions and labor precautions.

## 2020-08-01 LAB — 789231 7+OXYCODONE-BUND
Amphetamines, Urine: NEGATIVE ng/mL
BENZODIAZ UR QL: NEGATIVE ng/mL
Barbiturate screen, urine: NEGATIVE ng/mL
Cocaine (Metab.): NEGATIVE ng/mL
OPIATE SCREEN URINE: NEGATIVE ng/mL
Oxycodone/Oxymorphone, Urine: NEGATIVE ng/mL
PCP Quant, Ur: NEGATIVE ng/mL

## 2020-08-01 LAB — CANNABINOID CONFIRMATION, UR
CANNABINOIDS: POSITIVE — AB
Carboxy THC GC/MS Conf: 41 ng/mL

## 2020-08-04 ENCOUNTER — Other Ambulatory Visit: Payer: Self-pay

## 2020-08-04 ENCOUNTER — Ambulatory Visit: Payer: Medicaid Other | Admitting: Family Medicine

## 2020-08-04 VITALS — BP 112/71 | HR 107 | Temp 97.8°F | Wt 241.4 lb

## 2020-08-04 DIAGNOSIS — F1210 Cannabis abuse, uncomplicated: Secondary | ICD-10-CM

## 2020-08-04 DIAGNOSIS — R252 Cramp and spasm: Secondary | ICD-10-CM

## 2020-08-04 DIAGNOSIS — O0990 Supervision of high risk pregnancy, unspecified, unspecified trimester: Secondary | ICD-10-CM

## 2020-08-04 DIAGNOSIS — O2341 Unspecified infection of urinary tract in pregnancy, first trimester: Secondary | ICD-10-CM

## 2020-08-04 DIAGNOSIS — Z6791 Unspecified blood type, Rh negative: Secondary | ICD-10-CM

## 2020-08-04 DIAGNOSIS — Z8759 Personal history of other complications of pregnancy, childbirth and the puerperium: Secondary | ICD-10-CM

## 2020-08-04 DIAGNOSIS — F1111 Opioid abuse, in remission: Secondary | ICD-10-CM

## 2020-08-04 DIAGNOSIS — O26899 Other specified pregnancy related conditions, unspecified trimester: Secondary | ICD-10-CM

## 2020-08-04 DIAGNOSIS — O99213 Obesity complicating pregnancy, third trimester: Secondary | ICD-10-CM

## 2020-08-04 DIAGNOSIS — O99891 Other specified diseases and conditions complicating pregnancy: Secondary | ICD-10-CM

## 2020-08-04 MED ORDER — MAGNESIUM OXIDE 400 MG PO CAPS: 400.0000 mg | ORAL_CAPSULE | Freq: Two times a day (BID) | ORAL | 3 refills | Status: DC

## 2020-08-04 NOTE — Addendum Note (Signed)
Addended by: Heywood Bene on: 08/04/2020 11:17 AM   Modules accepted: Orders

## 2020-08-04 NOTE — Progress Notes (Signed)
Pt states she went to ER Thursday last week, 07/31/20, due to contractions and yeast infection. Pt states they could see contractions on the monitor and provided Diflucan. Pt is having improvement with the Diflucan. Taking PNV, ASA, and antibiotic to prevent UTI. Reviewed kick counts and card provided. Pt aware of [facility] U/S appt on 08/07/20.
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
## 2020-08-06 ENCOUNTER — Other Ambulatory Visit: Payer: Self-pay | Admitting: Advanced Practice Midwife

## 2020-08-06 DIAGNOSIS — O09299 Supervision of pregnancy with other poor reproductive or obstetric history, unspecified trimester: Secondary | ICD-10-CM

## 2020-08-06 DIAGNOSIS — O99210 Obesity complicating pregnancy, unspecified trimester: Secondary | ICD-10-CM

## 2020-08-07 ENCOUNTER — Ambulatory Visit: Payer: Medicaid Other | Attending: Maternal & Fetal Medicine

## 2020-08-07 ENCOUNTER — Other Ambulatory Visit: Payer: Self-pay

## 2020-08-07 DIAGNOSIS — E669 Obesity, unspecified: Secondary | ICD-10-CM | POA: Diagnosis not present

## 2020-08-07 DIAGNOSIS — O99213 Obesity complicating pregnancy, third trimester: Secondary | ICD-10-CM | POA: Insufficient documentation

## 2020-08-07 DIAGNOSIS — Z362 Encounter for other antenatal screening follow-up: Secondary | ICD-10-CM | POA: Diagnosis not present

## 2020-08-07 DIAGNOSIS — O09293 Supervision of pregnancy with other poor reproductive or obstetric history, third trimester: Secondary | ICD-10-CM | POA: Diagnosis not present

## 2020-08-07 DIAGNOSIS — O99210 Obesity complicating pregnancy, unspecified trimester: Secondary | ICD-10-CM

## 2020-08-07 DIAGNOSIS — O09299 Supervision of pregnancy with other poor reproductive or obstetric history, unspecified trimester: Secondary | ICD-10-CM

## 2020-08-07 DIAGNOSIS — Z3A33 33 weeks gestation of pregnancy: Secondary | ICD-10-CM | POA: Diagnosis not present

## 2020-08-11 ENCOUNTER — Encounter: Payer: Self-pay | Admitting: Advanced Practice Midwife

## 2020-08-12 ENCOUNTER — Observation Stay
Admission: EM | Admit: 2020-08-12 | Discharge: 2020-08-12 | Disposition: A | Discharge status: Home / Self Care | Payer: Medicaid Other | Attending: Obstetrics and Gynecology | Admitting: Obstetrics and Gynecology

## 2020-08-12 ENCOUNTER — Encounter: Payer: Self-pay | Admitting: Obstetrics and Gynecology

## 2020-08-12 ENCOUNTER — Other Ambulatory Visit: Payer: Self-pay

## 2020-08-12 DIAGNOSIS — O99210 Obesity complicating pregnancy, unspecified trimester: Secondary | ICD-10-CM

## 2020-08-12 DIAGNOSIS — O2341 Unspecified infection of urinary tract in pregnancy, first trimester: Secondary | ICD-10-CM

## 2020-08-12 DIAGNOSIS — O4693 Antepartum hemorrhage, unspecified, third trimester: Secondary | ICD-10-CM

## 2020-08-12 DIAGNOSIS — O26893 Other specified pregnancy related conditions, third trimester: Secondary | ICD-10-CM | POA: Diagnosis present

## 2020-08-12 DIAGNOSIS — R519 Headache, unspecified: Secondary | ICD-10-CM | POA: Diagnosis not present

## 2020-08-12 DIAGNOSIS — Z3A34 34 weeks gestation of pregnancy: Secondary | ICD-10-CM

## 2020-08-12 DIAGNOSIS — R55 Syncope and collapse: Secondary | ICD-10-CM | POA: Diagnosis present

## 2020-08-12 DIAGNOSIS — Z87891 Personal history of nicotine dependence: Secondary | ICD-10-CM | POA: Diagnosis not present

## 2020-08-12 DIAGNOSIS — F1920 Other psychoactive substance dependence, uncomplicated: Secondary | ICD-10-CM | POA: Insufficient documentation

## 2020-08-12 DIAGNOSIS — O0990 Supervision of high risk pregnancy, unspecified, unspecified trimester: Secondary | ICD-10-CM

## 2020-08-12 DIAGNOSIS — Z6791 Unspecified blood type, Rh negative: Secondary | ICD-10-CM

## 2020-08-12 LAB — URINALYSIS, ROUTINE W REFLEX MICROSCOPIC
Bilirubin Urine: NEGATIVE
Glucose, UA: NEGATIVE mg/dL
Hgb urine dipstick: NEGATIVE
Ketones, ur: NEGATIVE mg/dL
Leukocytes,Ua: NEGATIVE
Nitrite: NEGATIVE
Protein, ur: NEGATIVE mg/dL
Specific Gravity, Urine: 1.019 (ref 1.005–1.030)
pH: 6 (ref 5.0–8.0)

## 2020-08-12 LAB — URINE DRUG SCREEN, QUALITATIVE (ARMC ONLY)
Amphetamines, Ur Screen: NOT DETECTED
Barbiturates, Ur Screen: NOT DETECTED
Benzodiazepine, Ur Scrn: NOT DETECTED
Cannabinoid 50 Ng, Ur ~~LOC~~: NOT DETECTED
Cocaine Metabolite,Ur ~~LOC~~: NOT DETECTED
MDMA (Ecstasy)Ur Screen: NOT DETECTED
Methadone Scn, Ur: NOT DETECTED
Opiate, Ur Screen: NOT DETECTED
Phencyclidine (PCP) Ur S: NOT DETECTED
Tricyclic, Ur Screen: NOT DETECTED

## 2020-08-12 MED ORDER — PROCHLORPERAZINE MALEATE 10 MG PO TABS
10.0000 mg | ORAL_TABLET | Freq: Once | ORAL | Status: AC
  Administered 2020-08-12: 10 mg via ORAL
  Filled 2020-08-12 (×2): qty 1

## 2020-08-12 MED ORDER — ACETAMINOPHEN 325 MG PO TABS: 650.0000 mg | ORAL_TABLET | ORAL | Status: DC | PRN

## 2020-08-12 MED ORDER — PROCHLORPERAZINE MALEATE 10 MG PO TABS: 10.0000 mg | ORAL_TABLET | Freq: Four times a day (QID) | ORAL | 0 refills | Status: DC | PRN
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
## 2020-08-12 NOTE — Progress Notes (Signed)
Patient given discharge instructions, patient verbalizes understanding of discharge instructions. No concerns. Patient discharged home, volunteers came with wheelchair and took patient down to entrance where patient's family is picking her up.

## 2020-08-12 NOTE — OB Triage Note (Signed)
Pt is a 20 yo G2P1 at [redacted]w[redacted]d that presents from ED with c/o of VB, Fainting episode and headache. Pt states she passed out at home while alone with her child at around 11am this morning. Pt states she woke up face down in the living room and is unsure how long she was unconscious. Pt states she has had a headache since yesterday  rated 10/10 and states she saw spots before she passed out. Pt states after waking up she had some bloody discharge and blood streaking in her panties but nothing she needed a pad for. Pt states she has felt FM and denies LOF. Initial BP 113/73. Efm applied and initial FHT 165 and assessing.

## 2020-08-19 ENCOUNTER — Other Ambulatory Visit: Payer: Self-pay

## 2020-08-19 ENCOUNTER — Ambulatory Visit: Payer: Medicaid Other | Admitting: Advanced Practice Midwife

## 2020-08-19 VITALS — BP 114/76 | HR 98 | Temp 98.3°F | Wt 252.0 lb

## 2020-08-19 DIAGNOSIS — O99213 Obesity complicating pregnancy, third trimester: Secondary | ICD-10-CM

## 2020-08-19 DIAGNOSIS — F17200 Nicotine dependence, unspecified, uncomplicated: Secondary | ICD-10-CM

## 2020-08-19 DIAGNOSIS — F1210 Cannabis abuse, uncomplicated: Secondary | ICD-10-CM

## 2020-08-19 DIAGNOSIS — O99210 Obesity complicating pregnancy, unspecified trimester: Secondary | ICD-10-CM

## 2020-08-19 DIAGNOSIS — O0993 Supervision of high risk pregnancy, unspecified, third trimester: Secondary | ICD-10-CM

## 2020-08-19 DIAGNOSIS — F1111 Opioid abuse, in remission: Secondary | ICD-10-CM

## 2020-08-19 DIAGNOSIS — O0990 Supervision of high risk pregnancy, unspecified, unspecified trimester: Secondary | ICD-10-CM

## 2020-08-19 DIAGNOSIS — F1010 Alcohol abuse, uncomplicated: Secondary | ICD-10-CM
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
## 2020-08-19 NOTE — Progress Notes (Addendum)
Patient here for MH RV at 35 3/7. States she has not picked up her magnesium supplement because her Medicaid is not covering it. She states she continues to have leg cramping.  ED visit on 08/12/2020. Patient states she passed out and fell on her abdomen and wanted to make sure baby was ok. Also has questions about delivery based on her MyChart-she states she saw an induction appointment for 08/21/2020. Marland KitchenBurt Knack, RN

## 2020-08-26 ENCOUNTER — Ambulatory Visit: Payer: Medicaid Other | Admitting: Advanced Practice Midwife

## 2020-08-26 ENCOUNTER — Other Ambulatory Visit: Payer: Self-pay

## 2020-08-26 VITALS — BP 110/79 | HR 108 | Temp 97.5°F | Wt 252.2 lb

## 2020-08-26 DIAGNOSIS — O99213 Obesity complicating pregnancy, third trimester: Secondary | ICD-10-CM

## 2020-08-26 DIAGNOSIS — E669 Obesity, unspecified: Secondary | ICD-10-CM

## 2020-08-26 DIAGNOSIS — F17200 Nicotine dependence, unspecified, uncomplicated: Secondary | ICD-10-CM

## 2020-08-26 DIAGNOSIS — O0990 Supervision of high risk pregnancy, unspecified, unspecified trimester: Secondary | ICD-10-CM

## 2020-08-26 DIAGNOSIS — O0993 Supervision of high risk pregnancy, unspecified, third trimester: Secondary | ICD-10-CM

## 2020-08-26 DIAGNOSIS — F1210 Cannabis abuse, uncomplicated: Secondary | ICD-10-CM

## 2020-08-26 DIAGNOSIS — O99210 Obesity complicating pregnancy, unspecified trimester: Secondary | ICD-10-CM

## 2020-08-26 DIAGNOSIS — F1010 Alcohol abuse, uncomplicated: Secondary | ICD-10-CM

## 2020-08-26 NOTE — Progress Notes (Addendum)
[place] and NST referral received today from provider E. Sciora, CNM. [place] order faxed to [facility] with confirmation received.   Floy Sabina, RN

## 2020-08-26 NOTE — Progress Notes (Signed)
Here today for 36.3 week MH RV. Taking PNV QD. Denies ED/hospital visits since last RV. 36 week labs and packet today. Tawny Hopping, RN
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
## 2020-08-27 ENCOUNTER — Encounter: Payer: Self-pay | Admitting: Advanced Practice Midwife

## 2020-08-29 ENCOUNTER — Observation Stay: Payer: Medicaid Other

## 2020-08-29 ENCOUNTER — Telehealth: Payer: Self-pay

## 2020-08-29 ENCOUNTER — Observation Stay
Admission: EM | Admit: 2020-08-29 | Discharge: 2020-08-29 | Disposition: A | Discharge status: Home / Self Care | Payer: Medicaid Other | Attending: Obstetrics and Gynecology | Admitting: Obstetrics and Gynecology

## 2020-08-29 ENCOUNTER — Encounter: Payer: Self-pay | Admitting: Obstetrics and Gynecology

## 2020-08-29 DIAGNOSIS — Z87891 Personal history of nicotine dependence: Secondary | ICD-10-CM | POA: Diagnosis not present

## 2020-08-29 DIAGNOSIS — O26893 Other specified pregnancy related conditions, third trimester: Secondary | ICD-10-CM | POA: Diagnosis not present

## 2020-08-29 DIAGNOSIS — Z3A36 36 weeks gestation of pregnancy: Secondary | ICD-10-CM | POA: Insufficient documentation

## 2020-08-29 DIAGNOSIS — M549 Dorsalgia, unspecified: Secondary | ICD-10-CM | POA: Insufficient documentation

## 2020-08-29 DIAGNOSIS — O368130 Decreased fetal movements, third trimester, not applicable or unspecified: Secondary | ICD-10-CM | POA: Diagnosis not present

## 2020-08-29 LAB — URINALYSIS, COMPLETE (UACMP) WITH MICROSCOPIC
Bacteria, UA: NONE SEEN
Bilirubin Urine: NEGATIVE
Glucose, UA: NEGATIVE mg/dL
Hgb urine dipstick: NEGATIVE
Ketones, ur: NEGATIVE mg/dL
Leukocytes,Ua: NEGATIVE
Nitrite: NEGATIVE
Protein, ur: NEGATIVE mg/dL
Specific Gravity, Urine: 1.01 (ref 1.005–1.030)
pH: 7 (ref 5.0–8.0)

## 2020-08-29 LAB — WET PREP, GENITAL
Clue Cells Wet Prep HPF POC: NONE SEEN
Sperm: NONE SEEN
Trich, Wet Prep: NONE SEEN
Yeast Wet Prep HPF POC: NONE SEEN

## 2020-08-29 NOTE — OB Triage Note (Signed)
Patient presented to L&D for evaluation with complaints of abdominal pain since yesterday and hasn't felt fetal movement in two days. Denies leaking of fluid or vaginal bleeding.
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
## 2020-08-29 NOTE — Progress Notes (Signed)
Discharge home. [place] results reviewed with patient. Follow up care reviewed. Questions answered. Left floor ambulatory by self. Elaina Hoops

## 2020-08-29 NOTE — Telephone Encounter (Signed)
Call to [facility] to ascertain if [place] and NST appt scheduled. Receptionist states she talked with client earlier and appt is 09/02/2020 with [place] at 0915 and NST at 1045. Jossie Ng, RN

## 2020-08-30 LAB — CHLAMYDIA/GC NAA, CONFIRMATION
Chlamydia trachomatis, NAA: NEGATIVE
Neisseria gonorrhoeae, NAA: NEGATIVE

## 2020-08-30 LAB — CULTURE, BETA STREP (GROUP B ONLY): Strep Gp B Culture: NEGATIVE

## 2020-09-01 ENCOUNTER — Telehealth: Payer: Self-pay

## 2020-09-01 NOTE — Telephone Encounter (Signed)
Client desires to change 09/02/2020 [facility] RV appt to 09/04/2020 as has [facility] [place] and NST appt same day. Appt scheduled for 09/04/2020. Jossie Ng, RN

## 2020-09-02 ENCOUNTER — Ambulatory Visit: Payer: Medicaid Other

## 2020-09-02 ENCOUNTER — Encounter: Payer: Self-pay | Admitting: Advanced Practice Midwife

## 2020-09-02 DIAGNOSIS — R825 Elevated urine levels of drugs, medicaments and biological substances: Secondary | ICD-10-CM | POA: Insufficient documentation

## 2020-09-02 DIAGNOSIS — O409XX0 Polyhydramnios, unspecified trimester, not applicable or unspecified: Secondary | ICD-10-CM | POA: Insufficient documentation

## 2020-09-03 ENCOUNTER — Telehealth: Payer: Self-pay

## 2020-09-03 NOTE — Telephone Encounter (Signed)
Phone call to patient to inform of contact with [facility] this afternoon regarding plan for Induction. Informed patient to keep tomorrows scheduled appt with our clinic and that Induction date plan will be discussed further and also per [facility] they should have an induction date for her by the end of the week. Patient agreeable to above. Tawny Hopping, RN

## 2020-09-04 ENCOUNTER — Encounter: Payer: Self-pay | Admitting: Physician Assistant

## 2020-09-04 ENCOUNTER — Ambulatory Visit: Payer: Medicaid Other | Admitting: Physician Assistant

## 2020-09-04 ENCOUNTER — Ambulatory Visit: Payer: Medicaid Other

## 2020-09-04 ENCOUNTER — Other Ambulatory Visit: Payer: Self-pay

## 2020-09-04 VITALS — BP 106/68 | HR 113 | Temp 97.6°F | Wt 257.6 lb

## 2020-09-04 DIAGNOSIS — O3663X0 Maternal care for excessive fetal growth, third trimester, not applicable or unspecified: Secondary | ICD-10-CM | POA: Insufficient documentation

## 2020-09-04 DIAGNOSIS — O26843 Uterine size-date discrepancy, third trimester: Secondary | ICD-10-CM | POA: Insufficient documentation

## 2020-09-04 DIAGNOSIS — O99213 Obesity complicating pregnancy, third trimester: Secondary | ICD-10-CM

## 2020-09-04 DIAGNOSIS — F319 Bipolar disorder, unspecified: Secondary | ICD-10-CM

## 2020-09-04 DIAGNOSIS — O99210 Obesity complicating pregnancy, unspecified trimester: Secondary | ICD-10-CM

## 2020-09-04 DIAGNOSIS — F1210 Cannabis abuse, uncomplicated: Secondary | ICD-10-CM

## 2020-09-04 DIAGNOSIS — O0990 Supervision of high risk pregnancy, unspecified, unspecified trimester: Secondary | ICD-10-CM

## 2020-09-04 DIAGNOSIS — O0993 Supervision of high risk pregnancy, unspecified, third trimester: Secondary | ICD-10-CM
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
## 2020-09-04 NOTE — Progress Notes (Signed)
Pt went to [facility] on 08/29/20 due to decreased fetal movement and having contractions. Pt states she is still in a lot of pains, on a 0-10 scale, pain is at a 10 when she has the pains, nothing she can do makes it better. Taking PNV. Per [facility] phone call yesterday, [facility] should received induction date by the end of this week; pt aware.

## 2020-09-05 LAB — 789231 7+OXYCODONE-BUND
Amphetamines, Urine: NEGATIVE ng/mL
BENZODIAZ UR QL: NEGATIVE ng/mL
Barbiturate screen, urine: NEGATIVE ng/mL
Cannabinoid Quant, Ur: NEGATIVE ng/mL
Cocaine (Metab.): NEGATIVE ng/mL
OPIATE SCREEN URINE: NEGATIVE ng/mL
Oxycodone/Oxymorphone, Urine: NEGATIVE ng/mL
PCP Quant, Ur: NEGATIVE ng/mL

## 2020-09-08 NOTE — Addendum Note (Signed)
Addended by: Heywood Bene on: 09/08/2020 12:29 PM   Modules accepted: Orders

## 2020-09-09 ENCOUNTER — Other Ambulatory Visit: Payer: Self-pay

## 2020-09-09 ENCOUNTER — Observation Stay
Admission: EM | Admit: 2020-09-09 | Discharge: 2020-09-09 | Disposition: A | Discharge status: Home / Self Care | Payer: Medicaid Other | Attending: Obstetrics and Gynecology | Admitting: Obstetrics and Gynecology

## 2020-09-09 ENCOUNTER — Telehealth: Payer: Self-pay

## 2020-09-09 ENCOUNTER — Encounter: Payer: Self-pay | Admitting: Obstetrics and Gynecology

## 2020-09-09 DIAGNOSIS — O471 False labor at or after 37 completed weeks of gestation: Secondary | ICD-10-CM | POA: Diagnosis present

## 2020-09-09 DIAGNOSIS — O99210 Obesity complicating pregnancy, unspecified trimester: Secondary | ICD-10-CM

## 2020-09-09 DIAGNOSIS — Z87891 Personal history of nicotine dependence: Secondary | ICD-10-CM | POA: Insufficient documentation

## 2020-09-09 DIAGNOSIS — E669 Obesity, unspecified: Secondary | ICD-10-CM | POA: Insufficient documentation

## 2020-09-09 DIAGNOSIS — Z3A38 38 weeks gestation of pregnancy: Secondary | ICD-10-CM | POA: Diagnosis not present

## 2020-09-09 DIAGNOSIS — O3663X0 Maternal care for excessive fetal growth, third trimester, not applicable or unspecified: Secondary | ICD-10-CM

## 2020-09-09 DIAGNOSIS — Z6791 Unspecified blood type, Rh negative: Secondary | ICD-10-CM

## 2020-09-09 DIAGNOSIS — O0990 Supervision of high risk pregnancy, unspecified, unspecified trimester: Secondary | ICD-10-CM

## 2020-09-09 DIAGNOSIS — O2341 Unspecified infection of urinary tract in pregnancy, first trimester: Secondary | ICD-10-CM

## 2020-09-09 DIAGNOSIS — O99213 Obesity complicating pregnancy, third trimester: Secondary | ICD-10-CM | POA: Diagnosis not present

## 2020-09-09 NOTE — OB Triage Note (Addendum)
Pt presents c/o ctx. She states she was having ctx Sunday night that were 8 mins apart and then ctx picked up again yesterday ad they were 7 mins apart. Pt states her stomach has not stopped hurting today and she describes it as crampy and stabbing. Pt rates pain 10/10 on pains scale and states she has not been able to sleep since Sunday. Pt reports staying adequately hydrated. Pt states she is supposed to be induced in 4 days. Pt denies LOF or bleeding. Pt reports positive fetal movement.  VSS. Will continue to monitor.

## 2020-09-09 NOTE — Discharge Summary (Signed)
RN reviewed discharge instructions with patient. Gave patient opportunity for questions. All questions answered at this time. Pt verbalized understanding. Pt discharged home with her mother. 

## 2020-09-09 NOTE — Telephone Encounter (Signed)
Received written message from Dian Queen that client in pain and desires return call. EGA = 38 3/7. Call to client who report [facility] IOL scheduled for 09/13/2020 at 0800 but doesn't think she will make it until then. Reports onset of intermittent contractions every 7 - 8 minutes that began Sunday pm and has progressed to the point where belly is hurting (balling up) all the time. Rates pain as a 10. Denies leakage of fluid. Reports last fetal movement yesterday pm and none this am. Reports ate bacon, eggs, biscuits and gravy approx 2.5 hours ago. Liquid intake today has been water, milk and juice. Due to decreased fetal movement, referred to L & D for evaluation. As no appt scheduled in [facility], appt schedule for 09/11/2020. Jossie Ng, RN

## 2020-09-09 NOTE — Discharge Instructions (Signed)
*  Get some stool softeners and start taking them *Drink 8-10 bottles (16oz) of water a day
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
## 2020-09-11 ENCOUNTER — Encounter: Payer: Self-pay | Admitting: Physician Assistant

## 2020-09-11 ENCOUNTER — Other Ambulatory Visit: Payer: Self-pay

## 2020-09-11 ENCOUNTER — Ambulatory Visit: Payer: Medicaid Other | Admitting: Physician Assistant

## 2020-09-11 VITALS — BP 115/78 | HR 127 | Temp 98.8°F | Wt 261.4 lb

## 2020-09-11 DIAGNOSIS — O3663X0 Maternal care for excessive fetal growth, third trimester, not applicable or unspecified: Secondary | ICD-10-CM

## 2020-09-11 DIAGNOSIS — F1210 Cannabis abuse, uncomplicated: Secondary | ICD-10-CM

## 2020-09-11 DIAGNOSIS — F319 Bipolar disorder, unspecified: Secondary | ICD-10-CM

## 2020-09-11 DIAGNOSIS — O99210 Obesity complicating pregnancy, unspecified trimester: Secondary | ICD-10-CM

## 2020-09-11 DIAGNOSIS — O0990 Supervision of high risk pregnancy, unspecified, unspecified trimester: Secondary | ICD-10-CM

## 2020-09-11 DIAGNOSIS — O0993 Supervision of high risk pregnancy, unspecified, third trimester: Secondary | ICD-10-CM

## 2020-09-11 DIAGNOSIS — O99213 Obesity complicating pregnancy, third trimester: Secondary | ICD-10-CM
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
## 2020-09-11 NOTE — Progress Notes (Signed)
Patient here for MH RV at 38 5/7. Has IOL date scheduled for 09/13/2020, at 0800. Had ED visit on 09/09/2020.Marland KitchenBurt Knack, RN

## 2020-09-13 ENCOUNTER — Other Ambulatory Visit: Payer: Self-pay

## 2020-09-13 ENCOUNTER — Inpatient Hospital Stay
Admission: RE | Admit: 2020-09-13 | Discharge: 2020-09-16 | DRG: 787 | Disposition: A | Discharge status: Home / Self Care | Payer: Medicaid Other | Attending: Obstetrics and Gynecology | Admitting: Obstetrics and Gynecology

## 2020-09-13 ENCOUNTER — Encounter: Payer: Self-pay | Admitting: Obstetrics and Gynecology

## 2020-09-13 DIAGNOSIS — O26893 Other specified pregnancy related conditions, third trimester: Secondary | ICD-10-CM | POA: Diagnosis present

## 2020-09-13 DIAGNOSIS — Z20822 Contact with and (suspected) exposure to covid-19: Secondary | ICD-10-CM | POA: Diagnosis present

## 2020-09-13 DIAGNOSIS — D62 Acute posthemorrhagic anemia: Secondary | ICD-10-CM | POA: Diagnosis not present

## 2020-09-13 DIAGNOSIS — O99324 Drug use complicating childbirth: Secondary | ICD-10-CM | POA: Diagnosis present

## 2020-09-13 DIAGNOSIS — F1290 Cannabis use, unspecified, uncomplicated: Secondary | ICD-10-CM | POA: Diagnosis present

## 2020-09-13 DIAGNOSIS — O3663X0 Maternal care for excessive fetal growth, third trimester, not applicable or unspecified: Secondary | ICD-10-CM | POA: Diagnosis present

## 2020-09-13 DIAGNOSIS — Z87891 Personal history of nicotine dependence: Secondary | ICD-10-CM | POA: Diagnosis not present

## 2020-09-13 DIAGNOSIS — O99214 Obesity complicating childbirth: Secondary | ICD-10-CM | POA: Diagnosis present

## 2020-09-13 DIAGNOSIS — O339 Maternal care for disproportion, unspecified: Secondary | ICD-10-CM | POA: Diagnosis present

## 2020-09-13 DIAGNOSIS — Z98891 History of uterine scar from previous surgery: Secondary | ICD-10-CM

## 2020-09-13 DIAGNOSIS — O0993 Supervision of high risk pregnancy, unspecified, third trimester: Secondary | ICD-10-CM | POA: Diagnosis not present

## 2020-09-13 DIAGNOSIS — O9081 Anemia of the puerperium: Secondary | ICD-10-CM | POA: Diagnosis not present

## 2020-09-13 DIAGNOSIS — Z3A39 39 weeks gestation of pregnancy: Secondary | ICD-10-CM | POA: Diagnosis not present

## 2020-09-13 DIAGNOSIS — Z3490 Encounter for supervision of normal pregnancy, unspecified, unspecified trimester: Secondary | ICD-10-CM | POA: Diagnosis present

## 2020-09-13 LAB — URINE DRUG SCREEN, QUALITATIVE (ARMC ONLY)
Amphetamines, Ur Screen: NOT DETECTED
Barbiturates, Ur Screen: NOT DETECTED
Benzodiazepine, Ur Scrn: NOT DETECTED
Cannabinoid 50 Ng, Ur ~~LOC~~: NOT DETECTED
Cocaine Metabolite,Ur ~~LOC~~: NOT DETECTED
MDMA (Ecstasy)Ur Screen: NOT DETECTED
Methadone Scn, Ur: NOT DETECTED
Opiate, Ur Screen: NOT DETECTED
Phencyclidine (PCP) Ur S: NOT DETECTED
Tricyclic, Ur Screen: NOT DETECTED

## 2020-09-13 LAB — CBC
HCT: 28.5 % — ABNORMAL LOW (ref 36.0–46.0)
Hemoglobin: 9.8 g/dL — ABNORMAL LOW (ref 12.0–15.0)
MCH: 31.6 pg (ref 26.0–34.0)
MCHC: 34.4 g/dL (ref 30.0–36.0)
MCV: 91.9 fL (ref 80.0–100.0)
Platelets: 216 10*3/uL (ref 150–400)
RBC: 3.1 MIL/uL — ABNORMAL LOW (ref 3.87–5.11)
RDW: 12.5 % (ref 11.5–15.5)
WBC: 17.3 10*3/uL — ABNORMAL HIGH (ref 4.0–10.5)
nRBC: 0 % (ref 0.0–0.2)

## 2020-09-13 LAB — TYPE AND SCREEN
ABO/RH(D): A NEG
Antibody Screen: POSITIVE

## 2020-09-13 LAB — RESP PANEL BY RT-PCR (FLU A&B, COVID) ARPGX2
Influenza A by PCR: NEGATIVE
Influenza B by PCR: NEGATIVE
SARS Coronavirus 2 by RT PCR: NEGATIVE

## 2020-09-13 MED ORDER — MISOPROSTOL 25 MCG QUARTER TABLET: 25.0000 ug | ORAL_TABLET | ORAL | Status: DC

## 2020-09-13 MED ORDER — OXYTOCIN-SODIUM CHLORIDE 30-0.9 UT/500ML-% IV SOLN
1.0000 m[IU]/min | INTRAVENOUS | Status: DC
  Administered 2020-09-13: 2 m[IU]/min via INTRAVENOUS

## 2020-09-13 MED ORDER — OXYTOCIN 10 UNIT/ML IJ SOLN
INTRAMUSCULAR | Status: AC
  Filled 2020-09-13: qty 2

## 2020-09-13 MED ORDER — MISOPROSTOL 25 MCG QUARTER TABLET
25.0000 ug | ORAL_TABLET | Freq: Once | ORAL | Status: AC
  Administered 2020-09-13: 25 ug via BUCCAL
  Filled 2020-09-13: qty 1

## 2020-09-13 MED ORDER — OXYCODONE-ACETAMINOPHEN 5-325 MG PO TABS
1.0000 | ORAL_TABLET | ORAL | Status: DC | PRN
Start: 2020-09-13 — End: 2020-09-14

## 2020-09-13 MED ORDER — MISOPROSTOL 200 MCG PO TABS
ORAL_TABLET | ORAL | Status: AC
  Administered 2020-09-13: 25 ug via VAGINAL
  Filled 2020-09-13: qty 4

## 2020-09-13 MED ORDER — LIDOCAINE HCL (PF) 1 % IJ SOLN
30.0000 mL | INTRAMUSCULAR | Status: DC | PRN
  Filled 2020-09-13: qty 30

## 2020-09-13 MED ORDER — FENTANYL CITRATE (PF) 100 MCG/2ML IJ SOLN
50.0000 ug | INTRAMUSCULAR | Status: DC | PRN
  Administered 2020-09-13 – 2020-09-14 (×2): 100 ug via INTRAVENOUS
  Filled 2020-09-13 (×2): qty 2

## 2020-09-13 MED ORDER — LACTATED RINGERS IV SOLN: 500.0000 mL | INTRAVENOUS | Status: DC | PRN

## 2020-09-13 MED ORDER — MISOPROSTOL 25 MCG QUARTER TABLET
25.0000 ug | ORAL_TABLET | ORAL | Status: DC | PRN
Start: 2020-09-13 — End: 2020-09-14
  Administered 2020-09-13 – 2020-09-14 (×2): 25 ug via VAGINAL
  Filled 2020-09-13 (×3): qty 1

## 2020-09-13 MED ORDER — LACTATED RINGERS IV SOLN: INTRAVENOUS | Status: DC

## 2020-09-13 MED ORDER — AMMONIA AROMATIC IN INHA
RESPIRATORY_TRACT | Status: AC
  Filled 2020-09-13: qty 10

## 2020-09-13 MED ORDER — SOD CITRATE-CITRIC ACID 500-334 MG/5ML PO SOLN
30.0000 mL | ORAL | Status: DC | PRN
  Administered 2020-09-14: 30 mL via ORAL

## 2020-09-13 MED ORDER — ONDANSETRON HCL 4 MG/2ML IJ SOLN
4.0000 mg | Freq: Four times a day (QID) | INTRAMUSCULAR | Status: DC | PRN
  Administered 2020-09-13 – 2020-09-14 (×3): 4 mg via INTRAVENOUS
  Filled 2020-09-13 (×3): qty 2

## 2020-09-13 MED ORDER — OXYTOCIN-SODIUM CHLORIDE 30-0.9 UT/500ML-% IV SOLN
2.5000 [IU]/h | INTRAVENOUS | Status: DC
  Administered 2020-09-14 (×2): 30 [IU] via INTRAVENOUS
  Filled 2020-09-13 (×3): qty 500

## 2020-09-13 MED ORDER — OXYTOCIN BOLUS FROM INFUSION: 333.0000 mL | Freq: Once | INTRAVENOUS | Status: DC

## 2020-09-13 MED ORDER — ACETAMINOPHEN 325 MG PO TABS
650.0000 mg | ORAL_TABLET | ORAL | Status: DC | PRN
  Administered 2020-09-14: 650 mg via ORAL
  Filled 2020-09-13: qty 2

## 2020-09-13 MED ORDER — TERBUTALINE SULFATE 1 MG/ML IJ SOLN
0.2500 mg | Freq: Once | INTRAMUSCULAR | Status: DC | PRN
Start: 2020-09-13 — End: 2020-09-14

## 2020-09-13 MED ORDER — OXYCODONE-ACETAMINOPHEN 5-325 MG PO TABS: 2.0000 | ORAL_TABLET | ORAL | Status: DC | PRN

## 2020-09-13 NOTE — Progress Notes (Signed)
Pt presents to L&D for scheduled elective IOL at 39w. J.Oxley, CNM aware of pt arrival and admission orders placed. RN to admit pt into the unit and start IOL.

## 2020-09-13 NOTE — Progress Notes (Signed)
Labor Progress Note  Sandy Cox is a 20 y.o. G2P1001 at [redacted]w[redacted]d by ultrasound admitted for induction of labor due to Elective at term.  Subjective: Reports increasing cramping  Objective: BP 121/70 (BP Location: Left Arm)   Pulse (!) 116   Temp 98.5 F (36.9 C) (Oral)   Resp 18   Ht 5\' 6"  (1.676 m)   Wt 114.8 kg   LMP 11/08/2019   BMI 40.84 kg/m    Fetal Assessment: FHT:  FHR: 145 bpm, variability: moderate,  accelerations:  Present,  decelerations:  Absent Category/reactivity:  Category I UC:   regular, every 2-4 minutes SVE:    Dilation: 2cm  Effacement: 50%  Station:  -3  Consistency: soft  Position: posterior  Membrane status: Intact Amniotic color: n/a  Labs: Lab Results  Component Value Date   WBC 17.3 (H) 09/13/2020   HGB 9.8 (L) 09/13/2020   HCT 28.5 (L) 09/13/2020   MCV 91.9 09/13/2020   PLT 216 09/13/2020    Assessment / Plan: Induction of labor due to elective,  progressing well on pitocin  Labor:  Cytotec vaginal and buccal  given at 1019, will start pitocin now, will consider AROM as baby is more applied to pelvis Preeclampsia:   121/70 Fetal Wellbeing:  Category I Pain Control:  Labor support without medications I/D:   Afebrile, GBS neg, Intact Anticipated MOD:  NSVD  09/15/2020, CNM 09/13/2020, 2:29 PM

## 2020-09-13 NOTE — Progress Notes (Signed)
Labor Progress Note  Sandy Cox is a 20 y.o. G2P1001 at [redacted]w[redacted]d by ultrasound admitted for induction of labor due to Elective at term.  Subjective: Pt was given Fentanyl and reported intensity of UCs have reduced   Objective: BP 122/78 (BP Location: Left Arm)   Pulse 82   Temp 98.1 F (36.7 C) (Oral)   Resp 18   Ht 5\' 6"  (1.676 m)   Wt 114.8 kg   LMP 11/08/2019   BMI 40.84 kg/m    Fetal Assessment: FHT:  FHR: 120 bpm, variability: moderate,  accelerations:  Abscent,  decelerations:  Absent Category/reactivity:  Category I UC:   regular, every 1-3 minutes SVE:    Dilation: 2cm  Effacement: 50%  Station:  -3  Consistency: soft  Position: posterior  Membrane status: Intact Amniotic color: n/a  Assessment / Plan: Induction of labor due to elective,  progressing well on pitocin  Labor:  Cytotec vaginal and buccal  given at 1019, No change in cervix with the Pitocin at 22mU.  Pitocin turned off, may do another dose of cytotec through the night, will watch UCs for an hour. Preeclampsia:   122/78 Fetal Wellbeing:  Category I Pain Control:  Labor support without medications and IV pain meds I/D:   Afebrile, GBS neg, Intact Anticipated MOD:  NSVD  18m, CNM 09/13/2020, 9:52 PM
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
## 2020-09-13 NOTE — Progress Notes (Signed)
Labor Progress Note  Sandy Cox is a 20 y.o. G2P1001 at [redacted]w[redacted]d by ultrasound admitted for induction of labor due to Elective at term.  Subjective: Reports contractions now that she has to breath through  Objective: BP 106/66 (BP Location: Left Arm)   Pulse 97   Temp 98.5 F (36.9 C) (Oral)   Resp 18   Ht 5\' 6"  (1.676 m)   Wt 114.8 kg   LMP 11/08/2019   BMI 40.84 kg/m    Fetal Assessment: FHT:  FHR: 140 bpm, variability: moderate,  accelerations:  Abscent,  decelerations:  Absent Category/reactivity:  Category I UC:   regular, every 1-2 minutes SVE:    Dilation: 2cm  Effacement: 50%  Station:  -3  Consistency: soft  Position: posterior  Membrane status: Intact Amniotic color: n/a  Assessment / Plan: Induction of labor due to elective,  progressing well on pitocin  Labor:  Cytotec vaginal and buccal  given at 1019, Pitocin 97mU, will consider AROM as baby is more applied to pelvis Preeclampsia:   106/66 Fetal Wellbeing:  Category I Pain Control:  Labor support without medications I/D:   Afebrile, GBS neg, Intact Anticipated MOD:  NSVD  9m, CNM 09/13/2020, 5:49 PM

## 2020-09-14 ENCOUNTER — Inpatient Hospital Stay: Payer: Medicaid Other | Admitting: Anesthesiology

## 2020-09-14 ENCOUNTER — Encounter: Payer: Self-pay | Admitting: Obstetrics and Gynecology

## 2020-09-14 ENCOUNTER — Encounter
Admission: RE | Disposition: A | Discharge status: Home / Self Care | Payer: Self-pay | Source: Home / Self Care | Attending: Obstetrics and Gynecology

## 2020-09-14 DIAGNOSIS — O0993 Supervision of high risk pregnancy, unspecified, third trimester: Secondary | ICD-10-CM

## 2020-09-14 LAB — RPR: RPR Ser Ql: NONREACTIVE

## 2020-09-14 SURGERY — Surgical Case
Anesthesia: Epidural

## 2020-09-14 MED ORDER — SODIUM CHLORIDE 0.9 % IV SOLN
INTRAVENOUS | Status: DC | PRN
  Administered 2020-09-14: 10 mL via EPIDURAL

## 2020-09-14 MED ORDER — LIDOCAINE HCL (PF) 2 % IJ SOLN
INTRAMUSCULAR | Status: DC | PRN
Start: 2020-09-14 — End: 2020-09-14
  Administered 2020-09-14: 200 mg via EPIDURAL
  Administered 2020-09-14: 60 mg via EPIDURAL
  Administered 2020-09-14: 100 mg via EPIDURAL
  Administered 2020-09-14: 140 mg via EPIDURAL

## 2020-09-14 MED ORDER — PROPOFOL 10 MG/ML IV BOLUS
INTRAVENOUS | Status: DC | PRN
  Administered 2020-09-14 (×2): 40 mg via INTRAVENOUS

## 2020-09-14 MED ORDER — FENTANYL CITRATE (PF) 100 MCG/2ML IJ SOLN
INTRAMUSCULAR | Status: AC
  Filled 2020-09-14: qty 2

## 2020-09-14 MED ORDER — DEXAMETHASONE SODIUM PHOSPHATE 10 MG/ML IJ SOLN
INTRAMUSCULAR | Status: DC | PRN
  Administered 2020-09-14: 10 mg via INTRAVENOUS

## 2020-09-14 MED ORDER — LIDOCAINE HCL (PF) 1 % IJ SOLN
INTRAMUSCULAR | Status: DC | PRN
  Administered 2020-09-14: 3 mL via SUBCUTANEOUS

## 2020-09-14 MED ORDER — PHENYLEPHRINE 40 MCG/ML (10ML) SYRINGE FOR IV PUSH (FOR BLOOD PRESSURE SUPPORT): 80.0000 ug | PREFILLED_SYRINGE | INTRAVENOUS | Status: DC | PRN

## 2020-09-14 MED ORDER — OXYCODONE HCL 5 MG PO TABS: 5.0000 mg | ORAL_TABLET | ORAL | Status: DC | PRN

## 2020-09-14 MED ORDER — KETOROLAC TROMETHAMINE 30 MG/ML IJ SOLN
INTRAMUSCULAR | Status: DC | PRN
  Administered 2020-09-14: 30 mg via INTRAVENOUS

## 2020-09-14 MED ORDER — LACTATED RINGERS IV SOLN
500.0000 mL | Freq: Once | INTRAVENOUS | Status: AC
  Administered 2020-09-14: 500 mL via INTRAVENOUS

## 2020-09-14 MED ORDER — LIDOCAINE 5 % EX PTCH
MEDICATED_PATCH | CUTANEOUS | Status: AC
  Administered 2020-09-14: 1 via TRANSDERMAL
  Filled 2020-09-14: qty 2

## 2020-09-14 MED ORDER — MORPHINE SULFATE (PF) 0.5 MG/ML IJ SOLN
INTRAMUSCULAR | Status: AC
  Filled 2020-09-14: qty 10

## 2020-09-14 MED ORDER — SODIUM BICARBONATE 8.4 % IV SOLN
INTRAVENOUS | Status: AC
  Filled 2020-09-14: qty 50

## 2020-09-14 MED ORDER — FENTANYL CITRATE (PF) 100 MCG/2ML IJ SOLN
INTRAMUSCULAR | Status: DC | PRN
  Administered 2020-09-14: 100 ug via EPIDURAL

## 2020-09-14 MED ORDER — LIDOCAINE-EPINEPHRINE (PF) 2 %-1:200000 IJ SOLN
INTRAMUSCULAR | Status: DC | PRN
  Administered 2020-09-14: 3 mL via EPIDURAL

## 2020-09-14 MED ORDER — KETOROLAC TROMETHAMINE 30 MG/ML IJ SOLN
INTRAMUSCULAR | Status: AC
  Filled 2020-09-14: qty 1

## 2020-09-14 MED ORDER — DIPHENHYDRAMINE HCL 50 MG/ML IJ SOLN
12.5000 mg | INTRAMUSCULAR | Status: DC | PRN
Start: 2020-09-14 — End: 2020-09-14

## 2020-09-14 MED ORDER — FENTANYL-BUPIVACAINE-NACL 0.5-0.125-0.9 MG/250ML-% EP SOLN
EPIDURAL | Status: AC
  Filled 2020-09-14: qty 250

## 2020-09-14 MED ORDER — LIDOCAINE HCL (PF) 2 % IJ SOLN
INTRAMUSCULAR | Status: AC
  Filled 2020-09-14: qty 20

## 2020-09-14 MED ORDER — FENTANYL CITRATE (PF) 100 MCG/2ML IJ SOLN
INTRAMUSCULAR | Status: DC | PRN
  Administered 2020-09-14: 100 ug via INTRAVENOUS

## 2020-09-14 MED ORDER — ONDANSETRON HCL 4 MG/2ML IJ SOLN
INTRAMUSCULAR | Status: DC | PRN
  Administered 2020-09-14: 4 mg via INTRAVENOUS

## 2020-09-14 MED ORDER — EPHEDRINE 5 MG/ML INJ: 10.0000 mg | INTRAVENOUS | Status: DC | PRN

## 2020-09-14 MED ORDER — FENTANYL-BUPIVACAINE-NACL 0.5-0.125-0.9 MG/250ML-% EP SOLN
12.0000 mL/h | EPIDURAL | Status: DC | PRN
  Administered 2020-09-14: 12 mL/h via EPIDURAL

## 2020-09-14 MED ORDER — DEXAMETHASONE SODIUM PHOSPHATE 10 MG/ML IJ SOLN
INTRAMUSCULAR | Status: AC
  Filled 2020-09-14: qty 1

## 2020-09-14 MED ORDER — PROPOFOL 10 MG/ML IV BOLUS
INTRAVENOUS | Status: AC
  Filled 2020-09-14: qty 20

## 2020-09-14 MED ORDER — PHENYLEPHRINE HCL (PRESSORS) 10 MG/ML IV SOLN
INTRAVENOUS | Status: AC
  Filled 2020-09-14: qty 1

## 2020-09-14 MED ORDER — SODIUM CHLORIDE 0.9 % IV SOLN
INTRAVENOUS | Status: DC | PRN
  Administered 2020-09-14: 500 mg via INTRAVENOUS

## 2020-09-14 MED ORDER — CEFAZOLIN SODIUM-DEXTROSE 2-4 GM/100ML-% IV SOLN
INTRAVENOUS | Status: AC
  Filled 2020-09-14: qty 100

## 2020-09-14 MED ORDER — ONDANSETRON HCL 4 MG/2ML IJ SOLN
INTRAMUSCULAR | Status: AC
  Filled 2020-09-14: qty 2

## 2020-09-14 MED ORDER — OXYCODONE HCL 5 MG PO TABS
10.0000 mg | ORAL_TABLET | ORAL | Status: DC | PRN
Start: 2020-09-14 — End: 2020-09-16

## 2020-09-14 MED ORDER — CEFAZOLIN SODIUM-DEXTROSE 2-4 GM/100ML-% IV SOLN
2.0000 g | Freq: Once | INTRAVENOUS | Status: AC
  Administered 2020-09-14: 2 g via INTRAVENOUS

## 2020-09-14 MED ORDER — LIDOCAINE 5 % EX PTCH: 1.0000 | MEDICATED_PATCH | Freq: Once | CUTANEOUS | Status: AC

## 2020-09-14 MED ORDER — MORPHINE SULFATE (PF) 0.5 MG/ML IJ SOLN
INTRAMUSCULAR | Status: DC | PRN
  Administered 2020-09-14: 2.5 mg via EPIDURAL

## 2020-09-14 MED ORDER — SODIUM CHLORIDE 0.9 % IV SOLN
INTRAVENOUS | Status: AC
  Filled 2020-09-14: qty 500

## 2020-09-14 MED ORDER — PHENYLEPHRINE HCL (PRESSORS) 10 MG/ML IV SOLN
INTRAVENOUS | Status: DC | PRN
  Administered 2020-09-14 (×4): 200 ug via INTRAVENOUS

## 2020-09-14 MED ORDER — MORPHINE SULFATE (PF) 0.5 MG/ML IJ SOLN
INTRAMUSCULAR | Status: DC | PRN
  Administered 2020-09-14: 2.5 mg via INTRAVENOUS

## 2020-09-14 SURGICAL SUPPLY — 28 items
ADHESIVE MASTISOL STRL (MISCELLANEOUS) ×2 IMPLANT
BACTOSHIELD CHG 4% 4OZ (MISCELLANEOUS) ×1
BAG COUNTER SPONGE SURGICOUNT (BAG) ×2 IMPLANT
CHLORAPREP W/TINT 26 (MISCELLANEOUS) ×4 IMPLANT
DERMABOND ADVANCED (GAUZE/BANDAGES/DRESSINGS) ×1
DERMABOND ADVANCED .7 DNX12 (GAUZE/BANDAGES/DRESSINGS) ×1 IMPLANT
DRSG CURAD 3X16 NADH (PACKING) ×2 IMPLANT
DRSG TELFA 3X8 NADH (GAUZE/BANDAGES/DRESSINGS) ×2 IMPLANT
GAUZE SPONGE 4X4 12PLY STRL (GAUZE/BANDAGES/DRESSINGS) ×4 IMPLANT
GLOVE SURG POLY ORTHO LF SZ7.5 (GLOVE) ×2 IMPLANT
GOWN STRL REUS W/ TWL LRG LVL3 (GOWN DISPOSABLE) ×2 IMPLANT
GOWN STRL REUS W/TWL LRG LVL3 (GOWN DISPOSABLE) ×2
KIT TURNOVER KIT A (KITS) ×2 IMPLANT
MANIFOLD NEPTUNE II (INSTRUMENTS) ×2 IMPLANT
MAT PREVALON FULL STRYKER (MISCELLANEOUS) ×2 IMPLANT
NS IRRIG 1000ML POUR BTL (IV SOLUTION) ×2 IMPLANT
PACK C SECTION AR (MISCELLANEOUS) ×2 IMPLANT
PAD OB MATERNITY 4.3X12.25 (PERSONAL CARE ITEMS) ×2 IMPLANT
PAD PREP 24X41 OB/GYN DISP (PERSONAL CARE ITEMS) ×2 IMPLANT
PENCIL SMOKE EVACUATOR (MISCELLANEOUS) ×2 IMPLANT
RETRACTOR WND ALEXIS-O 25 LRG (MISCELLANEOUS) ×1 IMPLANT
RTRCTR WOUND ALEXIS O 25CM LRG (MISCELLANEOUS) ×2
SCRUB CHG 4% DYNA-HEX 4OZ (MISCELLANEOUS) ×1 IMPLANT
SPONGE T-LAP 18X18 ~~LOC~~+RFID (SPONGE) ×2 IMPLANT
SUT VIC AB 0 CTX 36 (SUTURE) ×2
SUT VIC AB 0 CTX36XBRD ANBCTRL (SUTURE) ×2 IMPLANT
SUT VIC AB 1 CT1 36 (SUTURE) ×4 IMPLANT
SUT VICRYL+ 3-0 36IN CT-1 (SUTURE) ×4 IMPLANT

## 2020-09-14 NOTE — Progress Notes (Signed)
Labor Progress Note  Sandy Cox is a 20 y.o. G2P1001 at [redacted]w[redacted]d by ultrasound admitted for induction of labor due to Elective at term.  Subjective: Pt comfortable with epidural now  Objective: BP 108/65   Pulse 85   Temp 98.1 F (36.7 C) (Oral)   Resp 16   Ht 5\' 6"  (1.676 m)   Wt 114.8 kg   LMP 11/08/2019   SpO2 100%   BMI 40.84 kg/m    Fetal Assessment: FHT:  FHR: 125 bpm, variability: moderate,  accelerations:  Present,  decelerations:  Absent Category/reactivity:  Category I UC:   regular, every 2-4 minutes SVE:    Dilation: 6 cm  Effacement: 80%  Station:  -1  Consistency: soft  Position: anterior  Membrane status: SROM at 0622 Amniotic color: MSAF  Assessment / Plan: Induction of labor due to elective  Labor: Cytotec given at 2330 and 0337, pt began laboring on her own, will monitor for progress Preeclampsia:  108/65 Fetal Wellbeing:  Category I Pain Control:  Epidural I/D:   Afebrile, GBS neg, SROM x 4 hrs  Anticipated MOD:  NSVD  Jenifer E Kash [place], CNM 09/14/2020, 9:45 AM

## 2020-09-14 NOTE — Progress Notes (Signed)
Labor Progress Note  Sandy Cox is a 20 y.o. G2P1001 at [redacted]w[redacted]d by ultrasound admitted for induction of labor due to Elective at term.  Subjective: Pt comfortable with epidural   Objective: BP 113/68   Pulse 71   Temp 98.8 F (37.1 C) (Oral)   Resp 16   Ht 5\' 6"  (1.676 m)   Wt 114.8 kg   LMP 11/08/2019   SpO2 100%   BMI 40.84 kg/m    Fetal Assessment: FHT:  FHR: 130 bpm, variability: moderate,  accelerations:  Present,  decelerations:  Present early Category/reactivity:  Category I UC:   regular, every 2-3 minutes SVE:    Dilation: 9.5 cm  Effacement: 100%  Station:  -1  Consistency: ---  Position: ---  Membrane status: SROM at 0622 Amniotic color: MSAF  Assessment / Plan: Induction of labor due to elective  Labor: Pitocin started at 1159, progressing well Preeclampsia:  113/68 Fetal Wellbeing:  Category I Pain Control:  Epidural I/D:   Afebrile, GBS neg, SROM x 8 hrs  Anticipated MOD:  NSVD  0623, CNM 09/14/2020, 2:13 PM
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
## 2020-09-14 NOTE — Anesthesia Procedure Notes (Signed)
Epidural Patient location during procedure: OB Start time: 09/14/2020 7:30 AM End time: 09/14/2020 7:57 AM  Staffing Anesthesiologist: Corinda Gubler, MD Performed: anesthesiologist   Preanesthetic Checklist Completed: patient identified, IV checked, site marked, risks and benefits discussed, surgical consent, monitors and equipment checked, pre-op evaluation and timeout performed  Epidural Patient position: sitting Prep: ChloraPrep Patient monitoring: heart rate, continuous pulse ox and blood pressure Approach: midline Location: L3-L4 Injection technique: LOR saline  Needle:  Needle type: Tuohy  Needle gauge: 17 G Needle length: 9 cm and 9 Needle insertion depth: 6 cm Catheter type: closed end flexible Catheter size: 19 Gauge Catheter at skin depth: 11 cm Test dose: negative and 2% lidocaine with Epi 1:200 K  Assessment Sensory level: T10 Events: blood not aspirated, injection not painful, no injection resistance, no paresthesia and negative IV test  Additional Notes first attempt Pt. Evaluated and documentation done after procedure finished. Patient identified. Risks/Benefits/Options discussed with patient including but not limited to bleeding, infection, nerve damage, paralysis, failed block, incomplete pain control, headache, blood pressure changes, nausea, vomiting, reactions to medication both or allergic, itching and postpartum back pain. Confirmed with bedside nurse the patient's most recent platelet count. Confirmed with patient that they are not currently taking any anticoagulation, have any bleeding history or any family history of bleeding disorders. Patient expressed understanding and wished to proceed. All questions were answered. Sterile technique was used throughout the entire procedure. Please see nursing notes for vital signs. Test dose was given through epidural catheter and negative prior to continuing to dose epidural or start infusion. Warning signs of high block  given to the patient including shortness of breath, tingling/numbness in hands, complete motor block, or any concerning symptoms with instructions to call for help. Patient was given instructions on fall risk and not to get out of bed. All questions and concerns addressed with instructions to call with any issues or inadequate analgesia.     Patient tolerated the insertion well without immediate complications.  Reason for block: procedure for painReason for block:procedure for pain

## 2020-09-14 NOTE — Transfer of Care (Signed)
Immediate Anesthesia Transfer of Care Note  Patient: Sandy Cox  Procedure(s) Performed: CESAREAN SECTION  Patient Location: OB  Anesthesia Type:Epidural  Level of Consciousness: awake, alert  and oriented  Airway & Oxygen Therapy: Patient Spontanous Breathing  Post-op Assessment: Report given to RN and Post -op Vital signs reviewed and stable  Post vital signs: Reviewed and stable  Last Vitals:  Vitals Value Taken Time  BP    Temp    Pulse    Resp    SpO2      Last Pain:  Vitals:   09/14/20 1700  TempSrc: Oral  PainSc:       Patients Stated Pain Goal: 0 (09/14/20 0600)  Complications: No notable events documented.

## 2020-09-14 NOTE — Progress Notes (Addendum)
Labor Progress Note  Sandy Cox is a 20 y.o. G2P1001 at [redacted]w[redacted]d by ultrasound admitted for induction of labor due to Elective at term.  Subjective: Pt feeling a lot of pressure  Objective: BP 118/74   Pulse 80   Temp 98.6 F (37 C) (Oral)   Resp 16   Ht 5\' 6"  (1.676 m)   Wt 114.8 kg   LMP 11/08/2019   SpO2 98%   BMI 40.84 kg/m    Fetal Assessment: FHT:  FHR: 125 bpm, variability: moderate,  accelerations:  Present,  decelerations:  Present early Category/reactivity:  Category I UC:   regular, every 2-3 minutes SVE:    Dilation: 10 cm  Effacement: 100%  Station:  +1  Consistency: ---  Position: ---  Membrane status: SROM at 0622 Amniotic color: MSAF  Assessment / Plan: Induction of labor due to elective  Labor: Pitocin started at 1159, progressing well, plan to labor down for 1 hour Preeclampsia:  118/74 Fetal Wellbeing:  Category I Pain Control:  Epidural I/D:   Afebrile, GBS neg, SROM x 10 hrs  Anticipated MOD:  NSVD  0623, CNM 09/14/2020, 4:09 PM

## 2020-09-14 NOTE — Op Note (Signed)
      OP NOTE  Date: 09/14/2020   9:02 PM Name Sandy Cox MR# [id number]  Preoperative Diagnosis: 1. Intrauterine pregnancy at [redacted]w[redacted]d Active Problems:   Encounter for elective induction of labor  2.  cephalo-pelvic disproportion, failure to progress: arrest of descent, and macrosomia  Postoperative Diagnosis: 1. Intrauterine pregnancy at [redacted]w[redacted]d, delivered 2. Viable infant 3. Remainder same as pre-op   Procedure: 1. Primary Low-Transverse Cesarean Section  Surgeon: Elonda Husky, MD  Assistant:  Annamary Rummage, CNM  Anesthesia: Epidural    EBL: 875 ml     Findings: 1) female infant, Apgar scores of 9   at 1 minute and 9   at 5 minutes and a birthweight of 144.27  ounces.    2) Normal uterus, tubes and ovaries.    Procedure:  The patient was prepped and draped in the supine position and placed under spinal anesthesia.  A transverse incision was made across the abdomen in a Pfannenstiel manner. If indicated the old scar was systematically removed with sharp dissection.  We carried the dissection down to the level of the fascia.  The fascia was incised in a curvilinear manner.  The fascia was then elevated from the rectus muscles with blunt and sharp dissection.  The rectus muscles were separated laterally exposing the peritoneum.  The peritoneum was carefully entered with care being taken to avoid bowel and bladder.  A self-retaining retractor was placed.  The visceral peritoneum was incised in a curvilinear fashion across the lower uterine segment creating a bladder flap. A transverse incision was made across the lower uterine segment and extended laterally and superiorly using the bandage scissors.  Artificial rupture membranes was performed and Clear fluid was noted.  The infant was delivered from the cephalic position.  A nuchal cord was not present. After an appropriate time interval, the cord was doubly clamped and cut. Cord blood was obtained if required.  The infant was  handed to the pediatric personnel  who then placed the infant under heat lamps where it was cleaned dried and suctioned as needed. The placenta was delivered. The hysterotomy incision was then identified on ring forceps.  The uterine cavity was cleaned with a moist lap sponge.  The hysterotomy incision was closed with a running interlocking suture of Vicryl.  This was followed by and imbricating stitch.  Hemostasis at this time was excellent.  Pitocin was run in the IV and the uterus was found to be firm. The posterior cul-de-sac and gutters were cleaned and inspected.  Hemostasis was noted.  The fascia was then closed with a running suture of #1 Vicryl.  Hemostasis of the subcutaneous tissues was obtained using the Bovie.  The subcutaneous tissues were closed with a running suture of 000 Vicryl.  A subcuticular suture was placed.  Derma bond was applied in the usual manner.  A pressure dressing was placed.  The patient went to the recovery room in stable condition.   Elonda Husky, M.D. 09/14/2020 9:02 PM

## 2020-09-15 ENCOUNTER — Encounter: Payer: Self-pay | Admitting: Obstetrics and Gynecology

## 2020-09-15 LAB — CBC
HCT: 26.2 % — ABNORMAL LOW (ref 36.0–46.0)
Hemoglobin: 8.9 g/dL — ABNORMAL LOW (ref 12.0–15.0)
MCH: 30.9 pg (ref 26.0–34.0)
MCHC: 34 g/dL (ref 30.0–36.0)
MCV: 91 fL (ref 80.0–100.0)
Platelets: 209 10*3/uL (ref 150–400)
RBC: 2.88 MIL/uL — ABNORMAL LOW (ref 3.87–5.11)
RDW: 12.5 % (ref 11.5–15.5)
WBC: 25.5 10*3/uL — ABNORMAL HIGH (ref 4.0–10.5)
nRBC: 0 % (ref 0.0–0.2)

## 2020-09-15 MED ORDER — IBUPROFEN 600 MG PO TABS
600.0000 mg | ORAL_TABLET | Freq: Four times a day (QID) | ORAL | Status: DC
  Administered 2020-09-15 – 2020-09-16 (×2): 600 mg via ORAL
  Filled 2020-09-15 (×2): qty 1

## 2020-09-15 MED ORDER — ACETAMINOPHEN 500 MG PO TABS
1000.0000 mg | ORAL_TABLET | Freq: Four times a day (QID) | ORAL | Status: AC
  Administered 2020-09-15 (×4): 1000 mg via ORAL
  Filled 2020-09-15 (×4): qty 2

## 2020-09-15 MED ORDER — DIPHENHYDRAMINE HCL 25 MG PO CAPS: 25.0000 mg | ORAL_CAPSULE | Freq: Four times a day (QID) | ORAL | Status: DC | PRN

## 2020-09-15 MED ORDER — NALBUPHINE HCL 10 MG/ML IJ SOLN: 5.0000 mg | Freq: Once | INTRAMUSCULAR | Status: DC | PRN

## 2020-09-15 MED ORDER — NALBUPHINE HCL 10 MG/ML IJ SOLN: 5.0000 mg | INTRAMUSCULAR | Status: DC | PRN

## 2020-09-15 MED ORDER — OXYTOCIN-SODIUM CHLORIDE 30-0.9 UT/500ML-% IV SOLN
INTRAVENOUS | Status: AC
  Administered 2020-09-15: 2.5 [IU]/h via INTRAVENOUS
  Filled 2020-09-15: qty 500

## 2020-09-15 MED ORDER — NALOXONE HCL 0.4 MG/ML IJ SOLN: 0.4000 mg | INTRAMUSCULAR | Status: DC | PRN

## 2020-09-15 MED ORDER — SENNOSIDES-DOCUSATE SODIUM 8.6-50 MG PO TABS
2.0000 | ORAL_TABLET | ORAL | Status: DC
  Administered 2020-09-15 – 2020-09-16 (×2): 2 via ORAL
  Filled 2020-09-15 (×2): qty 2

## 2020-09-15 MED ORDER — KETOROLAC TROMETHAMINE 30 MG/ML IJ SOLN: 30.0000 mg | Freq: Four times a day (QID) | INTRAMUSCULAR | Status: DC

## 2020-09-15 MED ORDER — IBUPROFEN 600 MG PO TABS: 600.0000 mg | ORAL_TABLET | Freq: Four times a day (QID) | ORAL | Status: DC

## 2020-09-15 MED ORDER — PRENATAL MULTIVITAMIN CH
1.0000 | ORAL_TABLET | Freq: Every day | ORAL | Status: DC
  Administered 2020-09-15 – 2020-09-16 (×2): 1 via ORAL
  Filled 2020-09-15 (×2): qty 1

## 2020-09-15 MED ORDER — SCOPOLAMINE 1 MG/3DAYS TD PT72
1.0000 | MEDICATED_PATCH | Freq: Once | TRANSDERMAL | Status: DC
  Administered 2020-09-15: 1.5 mg via TRANSDERMAL
  Filled 2020-09-15: qty 1

## 2020-09-15 MED ORDER — NALOXONE HCL 4 MG/10ML IJ SOLN
1.0000 ug/kg/h | INTRAVENOUS | Status: DC | PRN
  Filled 2020-09-15: qty 5

## 2020-09-15 MED ORDER — SODIUM CHLORIDE 0.9% FLUSH: 3.0000 mL | INTRAVENOUS | Status: DC | PRN

## 2020-09-15 MED ORDER — ONDANSETRON HCL 4 MG/2ML IJ SOLN: 4.0000 mg | Freq: Three times a day (TID) | INTRAMUSCULAR | Status: DC | PRN

## 2020-09-15 MED ORDER — OXYTOCIN-SODIUM CHLORIDE 30-0.9 UT/500ML-% IV SOLN: 2.5000 [IU]/h | INTRAVENOUS | Status: AC

## 2020-09-15 MED ORDER — LACTATED RINGERS IV SOLN: INTRAVENOUS | Status: DC

## 2020-09-15 MED ORDER — OXYCODONE-ACETAMINOPHEN 5-325 MG PO TABS: 1.0000 | ORAL_TABLET | ORAL | Status: DC | PRN

## 2020-09-15 MED ORDER — SIMETHICONE 80 MG PO CHEW
80.0000 mg | CHEWABLE_TABLET | Freq: Four times a day (QID) | ORAL | Status: DC
  Administered 2020-09-15 – 2020-09-16 (×3): 80 mg via ORAL
  Filled 2020-09-15 (×3): qty 1

## 2020-09-15 MED ORDER — ACETAMINOPHEN 500 MG PO TABS
1000.0000 mg | ORAL_TABLET | Freq: Four times a day (QID) | ORAL | Status: DC | PRN
  Administered 2020-09-16: 1000 mg via ORAL
  Filled 2020-09-15: qty 2

## 2020-09-15 MED ORDER — KETOROLAC TROMETHAMINE 30 MG/ML IJ SOLN
30.0000 mg | Freq: Four times a day (QID) | INTRAMUSCULAR | Status: DC
  Administered 2020-09-15 (×3): 30 mg via INTRAVENOUS
  Filled 2020-09-15 (×3): qty 1

## 2020-09-15 MED ORDER — ZOLPIDEM TARTRATE 5 MG PO TABS: 5.0000 mg | ORAL_TABLET | Freq: Every evening | ORAL | Status: DC | PRN

## 2020-09-15 MED ORDER — MENTHOL 3 MG MT LOZG
1.0000 | LOZENGE | OROMUCOSAL | Status: DC | PRN
  Filled 2020-09-15: qty 9

## 2020-09-15 NOTE — Plan of Care (Signed)
Transferred to Room 339 Post Op C-Section. Alert and oriented with cheerful affect. Color good, skin W&D. Assessment and VS WNL. Continuous Pulse Ox. Applied as per order and is monitoring Pt. O2 Sat. Pt. Oriented to room, Safety and Security, Fall Prevention and POC. Pt. V/o.

## 2020-09-15 NOTE — Progress Notes (Signed)
Patient ID: Sandy Cox, female   DOB: 2000/07/21, 20 y.o.   MRN: [id number]   Progress Note - Cesarean Delivery  Sandy Cox is a 20 y.o. G2P2002 now PP day 1 s/p C-Section, Low Transverse .   Subjective:  Patient reports no problems.  Her pain is controlled.  She has been out of bed.  Denies lightheadedness.    Objective:  Vital signs in last 24 hours: Temp:  [97.9 F (36.6 C)-98.8 F (37.1 C)] 97.9 F (36.6 C) (08/01 0737) Pulse Rate:  [68-183] 74 (08/01 0737) Resp:  [16-20] 20 (08/01 0737) BP: (93-129)/(58-85) 101/63 (08/01 0737) SpO2:  [73 %-100 %] 98 % (08/01 0737)  Physical Exam:  General: alert, cooperative, and no distress Lochia: appropriate Uterine Fundus: firm Incision: Dressing intact.    Data Review Recent Labs    09/13/20 0948 09/15/20 0545  HGB 9.8* 8.9*  HCT 28.5* 26.2*    Assessment:  Active Problems:   Encounter for elective induction of labor   Status post Cesarean section. Doing well postoperatively.     Plan:       Continue current care.  Out of bed  Patient may shower later this afternoon -dressing remover after shower  Likely discharge tomorrow.  Elonda Husky, M.D. 09/15/2020 10:04 AM

## 2020-09-15 NOTE — Anesthesia Post-op Follow-up Note (Signed)
  Anesthesia Pain Follow-up Note  Patient: Sandy Cox  Day #: 1  Date of Follow-up: 09/15/2020 Time: 8:04 AM  Last Vitals:  Vitals:   09/15/20 0600 09/15/20 0737  BP:  101/63  Pulse: 84 74  Resp:  20  Temp:  36.6 C  SpO2: 95% 98%    Level of Consciousness: alert  Pain: mild   Side Effects:Pruritis  Catheter Site Exam:clean, dry     Plan: D/C from anesthesia care at surgeon's request  Jules Schick

## 2020-09-15 NOTE — Anesthesia Postprocedure Evaluation (Signed)
Anesthesia Post Note  Patient: [name]  Procedure(s) Performed: CESAREAN SECTION  Patient location during evaluation: Mother Baby Anesthesia Type: Epidural Level of consciousness: awake and alert Pain management: pain level controlled Vital Signs Assessment: post-procedure vital signs reviewed and stable Respiratory status: spontaneous breathing, nonlabored ventilation and respiratory function stable Cardiovascular status: stable Postop Assessment: no headache, no backache and epidural receding Anesthetic complications: no   No notable events documented.   Last Vitals:  Vitals:   09/15/20 0600 09/15/20 0737  BP:  101/63  Pulse: 84 74  Resp:  20  Temp:  36.6 C  SpO2: 95% 98%    Last Pain:  Vitals:   09/15/20 0737  TempSrc: Oral  PainSc:                  Jules Schick

## 2020-09-15 NOTE — Plan of Care (Signed)
Post Partum 

## 2020-09-16 DIAGNOSIS — D62 Acute posthemorrhagic anemia: Secondary | ICD-10-CM | POA: Diagnosis not present

## 2020-09-16 DIAGNOSIS — Z98891 History of uterine scar from previous surgery: Secondary | ICD-10-CM

## 2020-09-16 LAB — RUBELLA SCREEN: Rubella: 5.49 index (ref >0.99)

## 2020-09-16 MED ORDER — ACETAMINOPHEN 500 MG PO TABS: 500.0000 mg | ORAL_TABLET | Freq: Four times a day (QID) | ORAL | 0 refills | Status: DC | PRN

## 2020-09-16 MED ORDER — SIMETHICONE 80 MG PO CHEW: 80.0000 mg | CHEWABLE_TABLET | Freq: Four times a day (QID) | ORAL | 0 refills | Status: DC | PRN

## 2020-09-16 MED ORDER — OXYCODONE-ACETAMINOPHEN 5-325 MG PO TABS: 1.0000 | ORAL_TABLET | Freq: Four times a day (QID) | ORAL | 0 refills | Status: DC | PRN

## 2020-09-16 MED ORDER — SENNOSIDES-DOCUSATE SODIUM 8.6-50 MG PO TABS: 2.0000 | ORAL_TABLET | ORAL | Status: DC

## 2020-09-16 MED ORDER — IBUPROFEN 600 MG PO TABS: 600.0000 mg | ORAL_TABLET | Freq: Four times a day (QID) | ORAL | 0 refills | Status: DC | PRN

## 2020-09-16 MED ORDER — IBUPROFEN 600 MG PO TABS
600.0000 mg | ORAL_TABLET | Freq: Four times a day (QID) | ORAL | Status: DC
  Administered 2020-09-16: 600 mg via ORAL
  Filled 2020-09-16: qty 1
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
## 2020-09-16 NOTE — Lactation Note (Signed)
This note was copied from a baby's chart. Lactation Consultation Note  Patient Name: Boy Darleene Cumpian WPVXY'I Date: 09/16/2020 Reason for consult: Initial assessment;Term;Other (Comment) (repeat c-section) Age:22 hours  Initial lactation visit. Mom is G2P2, delivered via repeat c-section. Mom did not BF her first child, but desires exclusive BF with this one.  Nursing staff reports independent feedings, adequate wet/stool diapers for HOL despite elevated bili levels. A circumcision is planned for today.  Baby was sleeping soundly in bassinet, with last feeding <2hrs ago. Mom getting ready to shower. Mom able to demonstrated hand expression successfully, and states she does this to get him to latch. LC praised mom for technique and knowledge. LC educated parents on feeding patterns and behaviors, growth spurts and cluster feeding, position/latch/alignment, ongoing output expectations, milk supply and demand and normal course of lactation.  LC provided anticipatory guidance for mom on breast changes, breast fullness and engorgement and management of both, and nipple care.  Information given for outpatient lactation services and community BF support. WIC referral faxed to local office per parents permission.  Maternal Data Has patient been taught Hand Expression?: Yes Does the patient have breastfeeding experience prior to this delivery?: No (Did not BF her first)  Feeding Mother's Current Feeding Choice: Breast Milk  LATCH Score                    Lactation Tools Discussed/Used    Interventions Interventions: Breast feeding basics reviewed;Hand express;Education  Discharge Discharge Education: Engorgement and breast care;Warning signs for feeding baby;Outpatient recommendation WIC Program: Yes  Consult Status Consult Status: Complete    Danford Bad 09/16/2020, 10:18 AM

## 2020-09-16 NOTE — Progress Notes (Signed)
Patient discharged home with infant. FOB present at discharge. Discharge instructions and prescriptions given and reviewed with patient. Patient verbalized understanding.   Follow-up appointment scheduled for pt on Tuesday, August 9th at 11:30am with Dr. Logan Bores.  Incision care went over in detail. Pt verbalized understanding.   Escorted out by volunteers.
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
## 2020-09-16 NOTE — Discharge Instructions (Addendum)
Discharge Instructions:   Follow-up Appointment: Tuesday, August 9th at 11:30am with Dr. Logan Bores at [facility]!  If there are any new medications, they have been ordered and will be available for pickup at the listed pharmacy on your way home from the hospital.   Call office if you have any of the following: headache, visual changes, fever >101.0 F, chills, shortness of breath, breast concerns, excessive vaginal bleeding, incision drainage or problems, leg pain or redness, depression or any other concerns. If you have vaginal discharge with an odor, let your doctor know.   It is normal to bleed for up to 6 weeks. You should not soak through more than 1 pad in 1 hour. If you have a blood clot larger than your fist with continued bleeding, call your doctor.   After a c-section, you should expect a small amount of blood or clear fluid coming from the incision and abdominal cramping/soreness. Inspect your incision site daily. Stand in front of a mirror to look for any redness, incision opening, or discolored/odorness drainage. Take a shower daily and continue good hygiene. Use own towel and washcloth (do not share). Make sure your sheets on your bed are clean. No pets sleeping around your incision site. Dressing will be removed at your postpartum visit. If the dressing does become wet or soiled underneath, it is okay to remove it.    Activity: Do not lift > 10 lbs for 6 weeks (do not lift anything heavier than your baby). No intercourse, tampons, swimming pools, hot tubs, baths (only showers) for 6 weeks.  No driving for 1-2 weeks. Continue prenatal vitamin, especially if breastfeeding. Increase calories and fluids (water) while breastfeeding.   Your milk will come in, in the next couple of days (right now it is colostrum). You may have a slight fever when your milk comes in, but it should go away on its own.  If it does not, and rises above 101 F please call the doctor. You will also feel  achy and your breasts will be firm. They will also start to leak. If you are breastfeeding, continue as you have been and you can pump/express milk for comfort.   If you have too much milk, your breasts can become engorged, which could lead to mastitis. This is an infection of the milk ducts. It can be very painful and you will need to notify your doctor to obtain a prescription for antibiotics. You can also treat it with a shower or hot/cold compress.   For concerns about your baby, please call your pediatrician.  For breastfeeding concerns, the lactation consultant can be reached at [phone].   Postpartum blues (feelings of happy one minute and sad another minute) are normal for the first few weeks but if it gets worse let your doctor know.   Congratulations! We enjoyed caring for you and your new bundle of joy!

## 2020-09-23 ENCOUNTER — Ambulatory Visit (INDEPENDENT_AMBULATORY_CARE_PROVIDER_SITE_OTHER): Payer: Medicaid Other | Admitting: Obstetrics and Gynecology

## 2020-09-23 ENCOUNTER — Encounter: Payer: Self-pay | Admitting: Obstetrics and Gynecology

## 2020-09-23 ENCOUNTER — Other Ambulatory Visit: Payer: Self-pay

## 2020-09-23 VITALS — BP 113/77 | HR 83 | Ht 66.0 in | Wt 253.1 lb

## 2020-09-23 DIAGNOSIS — Z9889 Other specified postprocedural states: Secondary | ICD-10-CM

## 2020-09-23 DIAGNOSIS — O335XX0 Maternal care for disproportion due to unusually large fetus, not applicable or unspecified: Secondary | ICD-10-CM

## 2020-09-23 NOTE — Progress Notes (Signed)
Pt present for incision check after c-section. Pt c/o legs and ankle swelling along with left ankle hurts.
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
## 2020-10-27 ENCOUNTER — Encounter: Payer: Self-pay | Admitting: Obstetrics and Gynecology

## 2020-10-28 ENCOUNTER — Encounter: Payer: Medicaid Other | Admitting: Obstetrics and Gynecology

## 2020-11-17 NOTE — Progress Notes (Deleted)
Sandy Cox is a 20 y.o. female who presents for a postpartum visit. She is 6 weeks postpartum following a Cesarean section. Patient {is/is not:9024} sexually active. Contraception method is {contraceptive method:5051}. Baby is feeding by {breast/bottle:69}.Postpartum depression screening: {neg default:13464}.

## 2020-11-18 ENCOUNTER — Encounter: Payer: Self-pay | Admitting: Obstetrics and Gynecology

## 2020-11-18 ENCOUNTER — Encounter: Payer: Medicaid Other | Admitting: Obstetrics and Gynecology

## 2020-11-27 ENCOUNTER — Encounter (HOSPITAL_COMMUNITY): Payer: Self-pay | Admitting: Emergency Medicine

## 2020-11-27 ENCOUNTER — Other Ambulatory Visit: Payer: Self-pay

## 2020-11-27 ENCOUNTER — Emergency Department (HOSPITAL_COMMUNITY)
Admission: EM | Admit: 2020-11-27 | Discharge: 2020-11-27 | Disposition: A | Discharge status: Home / Self Care | Payer: Medicaid Other | Attending: Emergency Medicine | Admitting: Emergency Medicine

## 2020-11-27 ENCOUNTER — Telehealth (HOSPITAL_COMMUNITY): Payer: Self-pay | Admitting: Emergency Medicine

## 2020-11-27 ENCOUNTER — Emergency Department (HOSPITAL_COMMUNITY): Payer: Medicaid Other

## 2020-11-27 DIAGNOSIS — N200 Calculus of kidney: Secondary | ICD-10-CM

## 2020-11-27 DIAGNOSIS — N9489 Other specified conditions associated with female genital organs and menstrual cycle: Secondary | ICD-10-CM | POA: Diagnosis not present

## 2020-11-27 DIAGNOSIS — R1011 Right upper quadrant pain: Secondary | ICD-10-CM | POA: Diagnosis present

## 2020-11-27 DIAGNOSIS — N132 Hydronephrosis with renal and ureteral calculous obstruction: Secondary | ICD-10-CM | POA: Diagnosis not present

## 2020-11-27 DIAGNOSIS — Z87891 Personal history of nicotine dependence: Secondary | ICD-10-CM | POA: Insufficient documentation

## 2020-11-27 LAB — CBC WITH DIFFERENTIAL/PLATELET
Abs Immature Granulocytes: 0.04 10*3/uL (ref 0.00–0.07)
Basophils Absolute: 0.1 10*3/uL (ref 0.0–0.1)
Basophils Relative: 1 %
Eosinophils Absolute: 0.2 10*3/uL (ref 0.0–0.5)
Eosinophils Relative: 2 %
HCT: 40.1 % (ref 36.0–46.0)
Hemoglobin: 13 g/dL (ref 12.0–15.0)
Immature Granulocytes: 0 %
Lymphocytes Relative: 36 %
Lymphs Abs: 4.6 10*3/uL — ABNORMAL HIGH (ref 0.7–4.0)
MCH: 26.6 pg (ref 26.0–34.0)
MCHC: 32.4 g/dL (ref 30.0–36.0)
MCV: 82.2 fL (ref 80.0–100.0)
Monocytes Absolute: 1.3 10*3/uL — ABNORMAL HIGH (ref 0.1–1.0)
Monocytes Relative: 10 %
Neutro Abs: 6.6 10*3/uL (ref 1.7–7.7)
Neutrophils Relative %: 51 %
Platelets: 277 10*3/uL (ref 150–400)
RBC: 4.88 MIL/uL (ref 3.87–5.11)
RDW: 15 % (ref 11.5–15.5)
WBC: 12.9 10*3/uL — ABNORMAL HIGH (ref 4.0–10.5)
nRBC: 0 % (ref 0.0–0.2)

## 2020-11-27 LAB — HCG, QUANTITATIVE, PREGNANCY: hCG, Beta Chain, Quant, S: <1 m[IU]/mL (ref <5)

## 2020-11-27 LAB — BASIC METABOLIC PANEL
Anion gap: 12 (ref 5–15)
BUN: 14 mg/dL (ref 6–20)
CO2: 19 mmol/L — ABNORMAL LOW (ref 22–32)
Calcium: 9 mg/dL (ref 8.9–10.3)
Chloride: 106 mmol/L (ref 98–111)
Creatinine, Ser: 0.94 mg/dL (ref 0.44–1.00)
GFR, Estimated: >60 mL/min (ref >60)
Glucose, Bld: 118 mg/dL — ABNORMAL HIGH (ref 70–99)
Potassium: 3.2 mmol/L — ABNORMAL LOW (ref 3.5–5.1)
Sodium: 137 mmol/L (ref 135–145)

## 2020-11-27 LAB — URINALYSIS, ROUTINE W REFLEX MICROSCOPIC
Bilirubin Urine: NEGATIVE
Glucose, UA: NEGATIVE mg/dL
Ketones, ur: 20 mg/dL — AB
Nitrite: POSITIVE — AB
Protein, ur: 30 mg/dL — AB
Specific Gravity, Urine: 1.012 (ref 1.005–1.030)
WBC, UA: >50 WBC/hpf — ABNORMAL HIGH (ref 0–5)
pH: 7 (ref 5.0–8.0)

## 2020-11-27 LAB — I-STAT BETA HCG BLOOD, ED (MC, WL, AP ONLY): I-stat hCG, quantitative: 43.4 m[IU]/mL — ABNORMAL HIGH (ref <5)

## 2020-11-27 MED ORDER — OXYCODONE-ACETAMINOPHEN 5-325 MG PO TABS: 1.0000 | ORAL_TABLET | Freq: Four times a day (QID) | ORAL | 0 refills | Status: DC | PRN

## 2020-11-27 MED ORDER — ONDANSETRON HCL 4 MG/2ML IJ SOLN
4.0000 mg | Freq: Once | INTRAMUSCULAR | Status: AC
  Administered 2020-11-27: 4 mg via INTRAVENOUS
  Filled 2020-11-27: qty 2

## 2020-11-27 MED ORDER — KETOROLAC TROMETHAMINE 30 MG/ML IJ SOLN
30.0000 mg | Freq: Once | INTRAMUSCULAR | Status: AC
  Administered 2020-11-27: 30 mg via INTRAVENOUS
  Filled 2020-11-27: qty 1

## 2020-11-27 MED ORDER — MORPHINE SULFATE (PF) 4 MG/ML IV SOLN
4.0000 mg | Freq: Once | INTRAVENOUS | Status: AC
Start: 2020-11-27 — End: 2020-11-27
  Administered 2020-11-27: 4 mg via INTRAVENOUS
  Filled 2020-11-27: qty 1

## 2020-11-27 NOTE — Telephone Encounter (Signed)
Pharmacy called requiring an addendum on the oxycodone prescription to not take more than 6 tablets in 24 hours.  This is done.

## 2020-11-27 NOTE — Discharge Instructions (Addendum)
Begin taking Percocet as prescribed as needed for pain.  If symptoms have not improved in the next 3 days, follow-up with [facility].  Their contact information has been provided in this discharge summary for you to call and make these arrangements.  Return to the emergency department if you develop high fever, worsening pain, or other new and concerning symptoms.

## 2020-11-27 NOTE — ED Notes (Signed)
Patient transported to CT 
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
## 2020-11-27 NOTE — ED Triage Notes (Signed)
Pt c/o sudden right mid/lower abd pain that began about 2 hrs ago. Pt also c/o vomiting due to pain.

## 2020-12-10 ENCOUNTER — Other Ambulatory Visit: Payer: Self-pay

## 2020-12-10 ENCOUNTER — Encounter: Payer: Self-pay | Admitting: Obstetrics and Gynecology

## 2020-12-10 ENCOUNTER — Ambulatory Visit (INDEPENDENT_AMBULATORY_CARE_PROVIDER_SITE_OTHER): Payer: Medicaid Other | Admitting: Obstetrics and Gynecology

## 2020-12-10 VITALS — BP 114/81 | HR 104 | Ht 66.0 in | Wt 235.0 lb

## 2020-12-10 DIAGNOSIS — Z3009 Encounter for other general counseling and advice on contraception: Secondary | ICD-10-CM

## 2020-12-10 DIAGNOSIS — R399 Unspecified symptoms and signs involving the genitourinary system: Secondary | ICD-10-CM | POA: Diagnosis not present

## 2020-12-10 LAB — POCT URINALYSIS DIPSTICK
Bilirubin, UA: NEGATIVE
Blood, UA: NEGATIVE
Glucose, UA: NEGATIVE
Ketones, UA: NEGATIVE
Leukocytes, UA: NEGATIVE
Nitrite, UA: NEGATIVE
Protein, UA: NEGATIVE
Spec Grav, UA: 1.01 (ref 1.010–1.025)
Urobilinogen, UA: 0.2 E.U./dL
pH, UA: 7 (ref 5.0–8.0)

## 2020-12-10 LAB — POCT URINE PREGNANCY: Preg Test, Ur: NEGATIVE
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
## 2020-12-10 NOTE — Progress Notes (Signed)
Pt present for birth control consult. Pt requested OCP. UPT-NEG. Pt reported recently being dx with kidney stone and requested UA. UA completed and documented.

## 2021-01-02 ENCOUNTER — Encounter: Payer: Self-pay | Admitting: Obstetrics and Gynecology

## 2021-01-06 ENCOUNTER — Other Ambulatory Visit: Payer: Self-pay

## 2021-01-06 ENCOUNTER — Ambulatory Visit (INDEPENDENT_AMBULATORY_CARE_PROVIDER_SITE_OTHER): Payer: Medicaid Other | Admitting: Obstetrics and Gynecology

## 2021-01-06 ENCOUNTER — Encounter: Payer: Self-pay | Admitting: Obstetrics and Gynecology

## 2021-01-06 VITALS — BP 128/84 | HR 101 | Wt 226.3 lb

## 2021-01-06 DIAGNOSIS — Z30430 Encounter for insertion of intrauterine contraceptive device: Secondary | ICD-10-CM

## 2021-01-06 MED ORDER — LEVONORGESTREL 20 MCG/DAY IU IUD
1.0000 | INTRAUTERINE_SYSTEM | Freq: Once | INTRAUTERINE | Status: AC
  Administered 2021-01-06: 1 via INTRAUTERINE

## 2021-01-06 NOTE — Addendum Note (Signed)
Addended by: Leola Brazil on: 01/06/2021 11:48 AM   Modules accepted: Orders
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
## 2021-02-26 ENCOUNTER — Encounter: Payer: Self-pay | Admitting: Emergency Medicine

## 2021-02-26 ENCOUNTER — Ambulatory Visit
Admission: EM | Admit: 2021-02-26 | Discharge: 2021-02-26 | Disposition: A | Discharge status: Home / Self Care | Payer: Medicaid Other | Attending: Urgent Care | Admitting: Urgent Care

## 2021-02-26 ENCOUNTER — Other Ambulatory Visit: Payer: Self-pay

## 2021-02-26 DIAGNOSIS — H9202 Otalgia, left ear: Secondary | ICD-10-CM

## 2021-02-26 DIAGNOSIS — R519 Headache, unspecified: Secondary | ICD-10-CM | POA: Diagnosis not present

## 2021-02-26 DIAGNOSIS — K047 Periapical abscess without sinus: Secondary | ICD-10-CM

## 2021-02-26 MED ORDER — NAPROXEN 500 MG PO TABS: 500.0000 mg | ORAL_TABLET | Freq: Two times a day (BID) | ORAL | 0 refills | Status: DC

## 2021-02-26 MED ORDER — AMOXICILLIN-POT CLAVULANATE 875-125 MG PO TABS: 1.0000 | ORAL_TABLET | Freq: Two times a day (BID) | ORAL | 0 refills | Status: DC

## 2021-02-26 MED ORDER — CETIRIZINE HCL 10 MG PO TABS: 10.0000 mg | ORAL_TABLET | Freq: Every day | ORAL | 0 refills | Status: AC

## 2021-02-26 MED ORDER — FLUTICASONE PROPIONATE 50 MCG/ACT NA SUSP: 2.0000 | Freq: Every day | NASAL | 12 refills | Status: DC

## 2021-02-26 NOTE — ED Provider Notes (Signed)
[facility]   MRN: [id number] DOB: 2000/12/13  Subjective:   Sandy Cox is a 21 y.o. female presenting for 1 week history of persistent and worsening left sided facial pain, ear pain. Has a history of poor dentition and has not been able to find a dentist that will take Medicaid. No fever, ear drainage, tinnitus, dizziness, runny or stuffy nose, throat pain, cough.   No current facility-administered medications for this encounter.  Current Outpatient Medications:    Prenatal Vit-Fe Fumarate-FA (PRENATAL MULTIVITAMIN) TABS tablet, Take 1 tablet by mouth daily at 12 noon. (Patient not taking: Reported on 01/06/2021), Disp: , Rfl:    No Known Allergies  Past Medical History:  Diagnosis Date   Anemia    Depression    Dyspnea    Headache    sinus   History of bipolar disorder    History of depression    History of dizziness    History of nausea    History of shortness of breath    History of weight loss    Migraine    Obesity    Ovarian cyst    Right calf pain    Uterine endometriosis    also cysts/ulcers per mother   Vaginal bleeding      Past Surgical History:  Procedure Laterality Date   CESAREAN SECTION  09/14/2020   Procedure: CESAREAN SECTION;  Surgeon: Linzie Collin, MD;  Location: [facility];  Service: Obstetrics;;   TONSILLECTOMY     T & A 2017   TONSILLECTOMY AND ADENOIDECTOMY N/A 02/12/2015   Procedure: TONSILLECTOMY AND ADENOIDECTOMY;  Surgeon: Bud Face, MD;  Location: [facility];  Service: ENT;  Laterality: N/A;   WISDOM TOOTH EXTRACTION      Family History  Problem Relation Age of Onset   Cancer Maternal Grandmother    Migraines Maternal Grandmother    Depression Maternal Grandmother    Cancer Maternal Grandfather    Hypertension Maternal Grandfather    Heart disease Maternal Grandfather    Migraines Maternal Grandfather    Migraines Mother    Bipolar disorder Mother    Multiple births Mother     Migraines Brother    Bipolar disorder Brother    Migraines Sister    Bipolar disorder Sister    Torticollis Son     Social History   Tobacco Use   Smoking status: Some Days    Packs/day: 1.00    Years: 6.00    Pack years: 6.00    Types: Cigarettes   Smokeless tobacco: Never   Tobacco comments:    Denies secondhand cigarette exposure.  Vaping Use   Vaping Use: Never used  Substance Use Topics   Alcohol use: Not Currently    Comment: Last ETOH use 09/21/2019   Drug use: Not Currently    Types: Marijuana    Comment: marijuana (last use 01/14/2020) heroin (last use at least 5 years ago), Xanax (last use at least 5 years ago), oxycodone (last use at least 5 years ago)    ROS   Objective:   Vitals: BP 105/71 (BP Location: Right Arm)    Pulse 77    Temp 98.2 F (36.8 C) (Oral)    Resp 18    SpO2 99%    Breastfeeding No   Physical Exam Constitutional:      General: She is not in acute distress.    Appearance: Normal appearance. She is well-developed and normal weight. She is not ill-appearing, toxic-appearing or diaphoretic.  HENT:     Head: Normocephalic and atraumatic.     Right Ear: Tympanic membrane, ear canal and external ear normal. No drainage or tenderness. No middle ear effusion. There is no impacted cerumen. Tympanic membrane is not erythematous.     Left Ear: Tympanic membrane, ear canal and external ear normal. No drainage or tenderness.  No middle ear effusion. There is no impacted cerumen. Tympanic membrane is not erythematous.     Nose: No congestion or rhinorrhea.     Mouth/Throat:     Mouth: Mucous membranes are moist. No oral lesions.     Pharynx: No pharyngeal swelling, oropharyngeal exudate, posterior oropharyngeal erythema or uvula swelling.     Tonsils: No tonsillar exudate or tonsillar abscesses.     Comments: Multiple missing molars, there are 2 over left side both upper and lower with multiple defects.  Eyes:     General: No scleral icterus.        Right eye: No discharge.        Left eye: No discharge.     Extraocular Movements: Extraocular movements intact.     Right eye: Normal extraocular motion.     Left eye: Normal extraocular motion.     Conjunctiva/sclera: Conjunctivae normal.  Cardiovascular:     Rate and Rhythm: Normal rate.  Pulmonary:     Effort: Pulmonary effort is normal.  Musculoskeletal:     Cervical back: Normal range of motion and neck supple.  Lymphadenopathy:     Cervical: No cervical adenopathy.  Skin:    General: Skin is warm and dry.  Neurological:     General: No focal deficit present.     Mental Status: She is alert and oriented to person, place, and time.  Psychiatric:        Mood and Affect: Mood normal.        Behavior: Behavior normal.    Assessment and Plan :   PDMP not reviewed this encounter.  1. Dental infection   2. Left ear pain   3. Facial pain    Unremarkable ENT exam.  Will use conservative management for what I suspect is eustachian tube dysfunction.  Recommended starting Flonase, Zyrtec, Sudafed. However, I suspect that her symptoms are likely coming from a dental infection. Will use Augmentin for this. Naproxen for pain and inflammation. Follow up with a dental specialist asap. Counseled patient on potential for adverse effects with medications prescribed/recommended today, ER and return-to-clinic precautions discussed, patient verbalized understanding.    Wallis Bamberg, PA-C 02/26/21 1422

## 2021-02-26 NOTE — ED Triage Notes (Signed)
Left ear pain x 1 week, worse the past 2 days.

## 2021-02-26 NOTE — Discharge Instructions (Addendum)
[facility] °Emergency dental service in [place], [place] °Address: [address], [place], [place] [address] °Phone: [phone] ° °[facility] °[phone] extension [phone] °[address] ° °[place] [place] °[phone] °[address] ° °[employer] °[phone] °[address] ° °[facility] °[phone] extension [phone] °[address] [place], [place], [address] °First come first serve for the first 10 clients.  May do simple extractions only, no wisdom teeth or surgery.  You may try the second for Thursday of the month starting at 6:30 AM. ° °[employer] °You may call the school to see if they are still helping to provide dental care for emergent cases. ° °

## 2021-05-18 ENCOUNTER — Encounter: Payer: Self-pay | Admitting: Obstetrics and Gynecology

## 2021-08-13 ENCOUNTER — Encounter: Payer: Self-pay | Admitting: Obstetrics and Gynecology

## 2021-08-13 ENCOUNTER — Ambulatory Visit (INDEPENDENT_AMBULATORY_CARE_PROVIDER_SITE_OTHER): Payer: Medicaid Other | Admitting: Obstetrics and Gynecology

## 2021-08-13 VITALS — BP 114/76 | HR 93 | Ht 66.0 in | Wt 184.0 lb

## 2021-08-13 DIAGNOSIS — Z30432 Encounter for removal of intrauterine contraceptive device: Secondary | ICD-10-CM | POA: Diagnosis not present

## 2021-08-13 DIAGNOSIS — Z30011 Encounter for initial prescription of contraceptive pills: Secondary | ICD-10-CM | POA: Diagnosis not present

## 2021-08-13 DIAGNOSIS — N926 Irregular menstruation, unspecified: Secondary | ICD-10-CM

## 2021-08-13 MED ORDER — LEVONORGEST-ETH ESTRAD 91-DAY 0.15-0.03 &0.01 MG PO TABS: 1.0000 | ORAL_TABLET | Freq: Every day | ORAL | 1 refills | Status: AC

## 2021-08-13 NOTE — Addendum Note (Signed)
Addended by: Elonda Husky on: 08/13/2021 11:59 AM   Modules accepted: Level of Service
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
## 2021-12-10 ENCOUNTER — Emergency Department: Payer: Medicaid Other

## 2021-12-10 ENCOUNTER — Telehealth: Payer: Self-pay | Admitting: Emergency Medicine

## 2021-12-10 ENCOUNTER — Emergency Department
Admission: EM | Admit: 2021-12-10 | Discharge: 2021-12-10 | Disposition: A | Discharge status: Home / Self Care | Payer: Medicaid Other | Attending: Emergency Medicine | Admitting: Emergency Medicine

## 2021-12-10 ENCOUNTER — Other Ambulatory Visit: Payer: Self-pay

## 2021-12-10 ENCOUNTER — Encounter: Payer: Self-pay | Admitting: Emergency Medicine

## 2021-12-10 DIAGNOSIS — R519 Headache, unspecified: Secondary | ICD-10-CM | POA: Diagnosis not present

## 2021-12-10 DIAGNOSIS — R109 Unspecified abdominal pain: Secondary | ICD-10-CM | POA: Diagnosis not present

## 2021-12-10 DIAGNOSIS — R110 Nausea: Secondary | ICD-10-CM | POA: Insufficient documentation

## 2021-12-10 DIAGNOSIS — Z202 Contact with and (suspected) exposure to infections with a predominantly sexual mode of transmission: Secondary | ICD-10-CM | POA: Diagnosis not present

## 2021-12-10 LAB — COMPREHENSIVE METABOLIC PANEL
ALT: 19 U/L (ref 0–44)
AST: 28 U/L (ref 15–41)
Albumin: 4.3 g/dL (ref 3.5–5.0)
Alkaline Phosphatase: 40 U/L (ref 38–126)
Anion gap: 7 (ref 5–15)
BUN: 19 mg/dL (ref 6–20)
CO2: 26 mmol/L (ref 22–32)
Calcium: 9.3 mg/dL (ref 8.9–10.3)
Chloride: 108 mmol/L (ref 98–111)
Creatinine, Ser: 0.9 mg/dL (ref 0.44–1.00)
GFR, Estimated: >60 mL/min (ref >60)
Glucose, Bld: 101 mg/dL — ABNORMAL HIGH (ref 70–99)
Potassium: 3.4 mmol/L — ABNORMAL LOW (ref 3.5–5.1)
Sodium: 141 mmol/L (ref 135–145)
Total Bilirubin: 0.8 mg/dL (ref 0.3–1.2)
Total Protein: 7.3 g/dL (ref 6.5–8.1)

## 2021-12-10 LAB — CBC WITH DIFFERENTIAL/PLATELET
Abs Immature Granulocytes: 0.05 10*3/uL (ref 0.00–0.07)
Basophils Absolute: 0 10*3/uL (ref 0.0–0.1)
Basophils Relative: 0 %
Eosinophils Absolute: 0.1 10*3/uL (ref 0.0–0.5)
Eosinophils Relative: 1 %
HCT: 42.1 % (ref 36.0–46.0)
Hemoglobin: 14.3 g/dL (ref 12.0–15.0)
Immature Granulocytes: 0 %
Lymphocytes Relative: 23 %
Lymphs Abs: 2.6 10*3/uL (ref 0.7–4.0)
MCH: 29.5 pg (ref 26.0–34.0)
MCHC: 34 g/dL (ref 30.0–36.0)
MCV: 87 fL (ref 80.0–100.0)
Monocytes Absolute: 1.2 10*3/uL — ABNORMAL HIGH (ref 0.1–1.0)
Monocytes Relative: 10 %
Neutro Abs: 7.3 10*3/uL (ref 1.7–7.7)
Neutrophils Relative %: 66 %
Platelets: 279 10*3/uL (ref 150–400)
RBC: 4.84 MIL/uL (ref 3.87–5.11)
RDW: 12.9 % (ref 11.5–15.5)
WBC: 11.3 10*3/uL — ABNORMAL HIGH (ref 4.0–10.5)
nRBC: 0 % (ref 0.0–0.2)

## 2021-12-10 LAB — RAPID HIV SCREEN (HIV 1/2 AB+AG)
HIV 1/2 Antibodies: NONREACTIVE
HIV-1 P24 Antigen - HIV24: NONREACTIVE

## 2021-12-10 LAB — WET PREP, GENITAL
Sperm: NONE SEEN
Trich, Wet Prep: NONE SEEN
WBC, Wet Prep HPF POC: <10 (ref <10)
Yeast Wet Prep HPF POC: NONE SEEN

## 2021-12-10 LAB — CHLAMYDIA/NGC RT PCR (ARMC ONLY)
Chlamydia Tr: DETECTED — AB
N gonorrhoeae: NOT DETECTED

## 2021-12-10 LAB — POC URINE PREG, ED: Preg Test, Ur: NEGATIVE

## 2021-12-10 MED ORDER — METRONIDAZOLE 500 MG PO TABS: 500.0000 mg | ORAL_TABLET | Freq: Two times a day (BID) | ORAL | 0 refills | Status: AC

## 2021-12-10 MED ORDER — SULFAMETHOXAZOLE-TRIMETHOPRIM 800-160 MG PO TABS: 1.0000 | ORAL_TABLET | Freq: Two times a day (BID) | ORAL | 0 refills | Status: AC

## 2021-12-10 MED ORDER — SODIUM CHLORIDE 0.9 % IV BOLUS
1000.0000 mL | Freq: Once | INTRAVENOUS | Status: AC
  Administered 2021-12-10: 1000 mL via INTRAVENOUS

## 2021-12-10 NOTE — Telephone Encounter (Signed)
Called patient.  She has chlamydia positive and dr Archie Balboa would like her to take doxycycline 100 mg twice daily for 7 days and discontinue the bactrim.  I explained this to her and called [facility] to cancel the bactrim and start the doxycycline.  She says her mouth is very sore and she cannot eat.  She is asking if the chlamydia is inher mouth, but we did not swab her orally.  I told her that she is still to take the doxycycline to treat it and that she needs to make an appointment with [facility] for follow up.  I also told her she can try to rinse mouth with salt water and see if that  helps.   I told her that the syphylis test take several days to result, but we will let her know about that as well.

## 2021-12-10 NOTE — Discharge Instructions (Signed)
Please be sure to follow up with the [facility]. Please seek medical attention for any high fevers, chest pain, shortness of breath, change in behavior, persistent vomiting, bloody stool or any other new or concerning symptoms.
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
## 2021-12-11 LAB — RPR: RPR Ser Ql: NONREACTIVE

## 2022-01-27 ENCOUNTER — Emergency Department
Admission: EM | Admit: 2022-01-27 | Discharge: 2022-01-27 | Disposition: A | Discharge status: Home / Self Care | Payer: Medicaid Other

## 2022-02-03 ENCOUNTER — Emergency Department (HOSPITAL_COMMUNITY)
Admission: EM | Admit: 2022-02-03 | Discharge: 2022-02-03 | Discharge status: Against Medical Advice | Payer: Medicaid Other

## 2022-02-03 NOTE — ED Notes (Signed)
No answer for triage.

## 2022-02-05 ENCOUNTER — Other Ambulatory Visit: Payer: Self-pay

## 2022-02-05 ENCOUNTER — Emergency Department (EMERGENCY_DEPARTMENT_HOSPITAL)
Admission: EM | Admit: 2022-02-05 | Discharge: 2022-02-06 | Disposition: A | Discharge status: Other Acute Inpatient Hospital | Payer: Medicaid Other | Source: Home / Self Care | Attending: Emergency Medicine | Admitting: Emergency Medicine

## 2022-02-05 ENCOUNTER — Encounter: Payer: Self-pay | Admitting: Emergency Medicine

## 2022-02-05 DIAGNOSIS — Z20822 Contact with and (suspected) exposure to covid-19: Secondary | ICD-10-CM | POA: Insufficient documentation

## 2022-02-05 DIAGNOSIS — F319 Bipolar disorder, unspecified: Secondary | ICD-10-CM | POA: Diagnosis present

## 2022-02-05 DIAGNOSIS — F32A Depression, unspecified: Secondary | ICD-10-CM | POA: Insufficient documentation

## 2022-02-05 DIAGNOSIS — M79645 Pain in left finger(s): Secondary | ICD-10-CM | POA: Insufficient documentation

## 2022-02-05 DIAGNOSIS — F1920 Other psychoactive substance dependence, uncomplicated: Secondary | ICD-10-CM | POA: Insufficient documentation

## 2022-02-05 DIAGNOSIS — R45851 Suicidal ideations: Secondary | ICD-10-CM

## 2022-02-05 DIAGNOSIS — F29 Unspecified psychosis not due to a substance or known physiological condition: Secondary | ICD-10-CM | POA: Insufficient documentation

## 2022-02-05 DIAGNOSIS — F1910 Other psychoactive substance abuse, uncomplicated: Secondary | ICD-10-CM

## 2022-02-05 LAB — BASIC METABOLIC PANEL
Anion gap: 6 (ref 5–15)
BUN: 12 mg/dL (ref 6–20)
CO2: 27 mmol/L (ref 22–32)
Calcium: 8.7 mg/dL — ABNORMAL LOW (ref 8.9–10.3)
Chloride: 108 mmol/L (ref 98–111)
Creatinine, Ser: 0.73 mg/dL (ref 0.44–1.00)
GFR, Estimated: >60 mL/min (ref >60)
Glucose, Bld: 94 mg/dL (ref 70–99)
Potassium: 4 mmol/L (ref 3.5–5.1)
Sodium: 141 mmol/L (ref 135–145)

## 2022-02-05 LAB — URINE DRUG SCREEN, QUALITATIVE (ARMC ONLY)
Amphetamines, Ur Screen: POSITIVE — AB
Barbiturates, Ur Screen: NOT DETECTED
Benzodiazepine, Ur Scrn: NOT DETECTED
Cannabinoid 50 Ng, Ur ~~LOC~~: POSITIVE — AB
Cocaine Metabolite,Ur ~~LOC~~: NOT DETECTED
MDMA (Ecstasy)Ur Screen: NOT DETECTED
Methadone Scn, Ur: NOT DETECTED
Opiate, Ur Screen: POSITIVE — AB
Phencyclidine (PCP) Ur S: NOT DETECTED
Tricyclic, Ur Screen: NOT DETECTED

## 2022-02-05 LAB — CBC
HCT: 42 % (ref 36.0–46.0)
Hemoglobin: 13.9 g/dL (ref 12.0–15.0)
MCH: 29.8 pg (ref 26.0–34.0)
MCHC: 33.1 g/dL (ref 30.0–36.0)
MCV: 89.9 fL (ref 80.0–100.0)
Platelets: 326 10*3/uL (ref 150–400)
RBC: 4.67 MIL/uL (ref 3.87–5.11)
RDW: 12.8 % (ref 11.5–15.5)
WBC: 10.1 10*3/uL (ref 4.0–10.5)
nRBC: 0 % (ref 0.0–0.2)

## 2022-02-05 LAB — URINALYSIS, ROUTINE W REFLEX MICROSCOPIC
Bilirubin Urine: NEGATIVE
Glucose, UA: NEGATIVE mg/dL
Hgb urine dipstick: NEGATIVE
Ketones, ur: NEGATIVE mg/dL
Leukocytes,Ua: NEGATIVE
Nitrite: NEGATIVE
Protein, ur: NEGATIVE mg/dL
Specific Gravity, Urine: 1.023 (ref 1.005–1.030)
pH: 6 (ref 5.0–8.0)

## 2022-02-05 LAB — RESP PANEL BY RT-PCR (RSV, FLU A&B, COVID)  RVPGX2
Influenza A by PCR: NEGATIVE
Influenza B by PCR: NEGATIVE
Resp Syncytial Virus by PCR: NEGATIVE
SARS Coronavirus 2 by RT PCR: NEGATIVE

## 2022-02-05 LAB — SALICYLATE LEVEL: Salicylate Lvl: <7 mg/dL — ABNORMAL LOW (ref 7.0–30.0)

## 2022-02-05 LAB — ACETAMINOPHEN LEVEL: Acetaminophen (Tylenol), Serum: <10 ug/mL — ABNORMAL LOW (ref 10–30)

## 2022-02-05 LAB — POC URINE PREG, ED: Preg Test, Ur: NEGATIVE

## 2022-02-05 LAB — ETHANOL: Alcohol, Ethyl (B): <10 mg/dL (ref <10)

## 2022-02-05 MED ORDER — DOXYCYCLINE HYCLATE 100 MG PO TABS
100.0000 mg | ORAL_TABLET | Freq: Two times a day (BID) | ORAL | Status: DC
  Administered 2022-02-05 – 2022-02-06 (×4): 100 mg via ORAL
  Filled 2022-02-05 (×4): qty 1

## 2022-02-05 MED ORDER — IBUPROFEN 600 MG PO TABS
600.0000 mg | ORAL_TABLET | Freq: Once | ORAL | Status: AC | PRN
  Administered 2022-02-05: 600 mg via ORAL
  Filled 2022-02-05: qty 1

## 2022-02-05 MED ORDER — ACETAMINOPHEN 325 MG PO TABS
650.0000 mg | ORAL_TABLET | Freq: Once | ORAL | Status: AC
  Administered 2022-02-05: 650 mg via ORAL
  Filled 2022-02-05: qty 2

## 2022-02-05 NOTE — ED Notes (Signed)
Covid swab obtained by this RN and sent to lab. TTS made aware of patient's request to speak with her and states will come speak with her.

## 2022-02-05 NOTE — ED Notes (Signed)
Pts breakfast at bedside, pt currently sleeping.
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
## 2022-02-05 NOTE — ED Notes (Signed)
Pt dressed out in burgundy scrubs with myself and BPD in room.  Pts belongings, include, long sleeve gray shirt, bra, pants, underwear, sneakers, socka, 2 white colored rings, 1 bracelet, hair tie ans 1 pair of silvered colored nipple rings.  Items bagged, labelled and placed at nurses station.  Pt ambulated to 20 H without incidence.

## 2022-02-05 NOTE — ED Notes (Signed)
Pt boyfriend here to visit.

## 2022-02-05 NOTE — BH Assessment (Signed)
Writer spoke with the patient to complete an updated/reassessment. Spoke with patient's father and he shared pictures of her stating she wanted to end her life.

## 2022-02-05 NOTE — ED Notes (Signed)
IVC/pending psych consult 

## 2022-02-05 NOTE — ED Notes (Signed)
Pt received dinner tray and drink at this time.

## 2022-02-05 NOTE — ED Notes (Signed)
TTS at bedside to speak with patient at this time regarding plan of care.

## 2022-02-05 NOTE — BH Assessment (Addendum)
Pt provided this Clinical research associate permission to speak with her family. Writer notified pt's family of pt's plan of care. Family verbalized concerns about the pt being home to be with her children for Christmas. Family became adamant that they could make sure that the pt is safe at all times, and that she needed to go to drug rehab. After further explanation, pt's family verbalized an understanding of the pt's recommendation for inpatient treatment.

## 2022-02-05 NOTE — ED Notes (Signed)
Pt given shower supplies, currently in shower. 
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
## 2022-02-05 NOTE — BH Assessment (Signed)
Patient is to be admitted to [facility] by Psychiatric Nurse Practitioner  Valetta Mole .  Attending Physician will be Dr.  Toni Amend .   Patient has been assigned to room 306, by [facility] Charge Nurse GiGi.   Intake Paper Work has been signed and placed on patient chart.  ER staff is aware of the admission: Carlene, ER Secretary   Dr. Scotty Court, ER MD  Aundra Millet, Patient's Nurse  Rosey Bath, Patient Access.   Pt can be transported anytime after 8 AM 02/06/22.

## 2022-02-05 NOTE — ED Notes (Signed)
Family to ED to "pick up patient". I met with them. Informed them she is unable to be signed out as she is under IVC by the judge and MD. And our psych team has left today and didn't inform me of any plans to DC the patient. Family was made aware to call back tonight to speak to night shift psych team.

## 2022-02-05 NOTE — ED Notes (Signed)
Pt complaint of finger pain. MD made aware. Verbal orders for tylenol.

## 2022-02-05 NOTE — ED Notes (Signed)
EDP Dr. Scotty Court at bedside.
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
## 2022-02-05 NOTE — ED Provider Notes (Signed)
[facility] Provider Note    Event Date/Time   First MD Initiated Contact with Patient 02/05/22 [phone]     (approximate)   History   Mental Health Problem   HPI  Sandy Cox is a 21 y.o. female with a history of bipolar disorder, polysubstance abuse, and recent treatment 24 hours ago at [facility] for a left index finger felon which was treated with incision and drainage and doxycycline who is brought to the ED tonight under involuntary commitment.  According to IVC documentation, she made multiple statements to her father that she wanted to die, so he pursued commitment for her safety.  Patient complains of pain in the left finger, but states that the pain is much less now than it was prior to treatment at [facility].  She has not unwrapped the dressing since it was initially placed at [facility]     Physical Exam   Triage Vital Signs: ED Triage Vitals [02/05/22 0101]  Enc Vitals Group     BP (!) 138/102     Pulse Rate (!) 105     Resp 15     Temp 98 F (36.7 C)     Temp Source Oral     SpO2 100 %     Weight 149 lb 14.6 oz (68 kg)     Height 5\' 5"  (1.651 m)     Head Circumference      Peak Flow      Pain Score      Pain Loc      Pain Edu?      Excl. in GC?     Most recent vital signs: Vitals:   02/05/22 0101  BP: (!) 138/102  Pulse: (!) 105  Resp: 15  Temp: 98 F (36.7 C)  SpO2: 100%    General: Awake, no distress.  Calm, normal mental status CV:  Good peripheral perfusion.  Resp:  Normal effort.  Abd:  No distention.  Other:  Left index finger status post I&D of the volar finger pad.  There is packing in place.  There is swelling and erythema, but patient notes that it is much improved compared to 1 day ago prior to treatment.  Small amount of drainage on the gauze wrap that was removed.   ED Results / Procedures / Treatments   Labs (all labs ordered are listed, but only abnormal results are displayed) Labs Reviewed  BASIC METABOLIC PANEL -  Abnormal; Notable for the following components:      Result Value   Calcium 8.7 (*)    All other components within normal limits  SALICYLATE LEVEL - Abnormal; Notable for the following components:   Salicylate Lvl <7.0 (*)    All other components within normal limits  ACETAMINOPHEN LEVEL - Abnormal; Notable for the following components:   Acetaminophen (Tylenol), Serum <10 (*)    All other components within normal limits  URINALYSIS, ROUTINE W REFLEX MICROSCOPIC - Abnormal; Notable for the following components:   Color, Urine YELLOW (*)    APPearance CLOUDY (*)    All other components within normal limits  URINE DRUG SCREEN, QUALITATIVE ([facility] ONLY) - Abnormal; Notable for the following components:   Amphetamines, Ur Screen POSITIVE (*)    Opiate, Ur Screen POSITIVE (*)    Cannabinoid 50 Ng, Ur [place] POSITIVE (*)    All other components within normal limits  CBC  ETHANOL  POC URINE PREG, ED     RADIOLOGY    PROCEDURES:  Procedures   MEDICATIONS ORDERED IN ED: Medications  doxycycline (VIBRA-TABS) tablet 100 mg (100 mg Oral Given 02/05/22 0217)     IMPRESSION / MDM / ASSESSMENT AND PLAN / ED COURSE  I reviewed the triage vital signs and the nursing notes.                              Patient brought to the ED under involuntary commitment due to suicidal ideation.  She has a history of polysubstance abuse, and UDS is positive for multiple substances.  Finger infection appears to be improving.  New clean dressing was applied by myself.  She is medically stable.  Will order her doxycycline antibiotic, consult psychiatry.  The patient has been placed in psychiatric observation due to the need to provide a safe environment for the patient while obtaining psychiatric consultation and evaluation, as well as ongoing medical and medication management to treat the patient's condition.  The patient has been placed under full IVC at this time.        FINAL CLINICAL  IMPRESSION(S) / ED DIAGNOSES   Final diagnoses:  Polysubstance abuse (HCC)  Suicidal ideation     Rx / DC Orders   ED Discharge Orders     None        Note:  This document was prepared using Dragon voice recognition software and may include unintentional dictation errors.   Sharman Cheek, MD 02/05/22 [phone]

## 2022-02-05 NOTE — ED Notes (Signed)
Pt given nighttime snack. 

## 2022-02-05 NOTE — ED Notes (Signed)
[place] [employer] here to question patient in private room.

## 2022-02-05 NOTE — ED Notes (Signed)
New dressing applied to patient's R index finger at this time. Pt with recent I&D for which patient is receiving PO abx for. Dried blood noted to bandage on assessment.

## 2022-02-05 NOTE — ED Notes (Signed)
Patient back to [facility]

## 2022-02-05 NOTE — ED Notes (Signed)
Pt. Alert and oriented, warm and dry, in no distress. Pt. Denies SI, HI, and AVH. Pt states she just needs some out patient resources.  Writer explained she is currently IVC and can not leave until she is seen by psych team.  Writer advised it might not be until later in the AM before seeing psych provider but she will be seen by EDP in a few.  Patient states she just had surgery on left index finger and supposed to be on doxycycline for the infection. Pt. Encouraged to let nursing staff know of any concerns or needs.   ENVIRONMENTAL ASSESSMENT Potentially harmful objects out of patient reach: Yes.   Personal belongings secured: Yes.   Patient dressed in hospital provided attire only: Yes.   Plastic bags out of patient reach: Yes.   Patient care equipment (cords, cables, call bells, lines, and drains) shortened, removed, or accounted for: Yes.   Equipment and supplies removed from bottom of stretcher: Yes.   Potentially toxic materials out of patient reach: Yes.   Sharps container removed or out of patient reach: Yes.

## 2022-02-06 ENCOUNTER — Inpatient Hospital Stay
Admission: RE | Admit: 2022-02-06 | Discharge: 2022-02-07 | DRG: 885 | Disposition: A | Discharge status: Home / Self Care | Payer: Medicaid Other | Attending: Rehabilitation | Admitting: Rehabilitation

## 2022-02-06 ENCOUNTER — Other Ambulatory Visit: Payer: Self-pay

## 2022-02-06 ENCOUNTER — Encounter: Payer: Self-pay | Admitting: Psychiatry

## 2022-02-06 DIAGNOSIS — Z818 Family history of other mental and behavioral disorders: Secondary | ICD-10-CM

## 2022-02-06 DIAGNOSIS — F1920 Other psychoactive substance dependence, uncomplicated: Secondary | ICD-10-CM | POA: Diagnosis present

## 2022-02-06 DIAGNOSIS — F6089 Other specific personality disorders: Secondary | ICD-10-CM | POA: Diagnosis present

## 2022-02-06 DIAGNOSIS — F1590 Other stimulant use, unspecified, uncomplicated: Secondary | ICD-10-CM | POA: Diagnosis present

## 2022-02-06 DIAGNOSIS — F17200 Nicotine dependence, unspecified, uncomplicated: Secondary | ICD-10-CM | POA: Diagnosis present

## 2022-02-06 DIAGNOSIS — Z79899 Other long term (current) drug therapy: Secondary | ICD-10-CM

## 2022-02-06 DIAGNOSIS — F332 Major depressive disorder, recurrent severe without psychotic features: Principal | ICD-10-CM | POA: Diagnosis present

## 2022-02-06 DIAGNOSIS — Z1152 Encounter for screening for COVID-19: Secondary | ICD-10-CM

## 2022-02-06 DIAGNOSIS — F429 Obsessive-compulsive disorder, unspecified: Secondary | ICD-10-CM | POA: Diagnosis present

## 2022-02-06 DIAGNOSIS — G4700 Insomnia, unspecified: Secondary | ICD-10-CM | POA: Diagnosis present

## 2022-02-06 DIAGNOSIS — Z809 Family history of malignant neoplasm, unspecified: Secondary | ICD-10-CM

## 2022-02-06 DIAGNOSIS — R45851 Suicidal ideations: Secondary | ICD-10-CM | POA: Diagnosis present

## 2022-02-06 DIAGNOSIS — F1210 Cannabis abuse, uncomplicated: Secondary | ICD-10-CM | POA: Diagnosis present

## 2022-02-06 DIAGNOSIS — L03012 Cellulitis of left finger: Secondary | ICD-10-CM | POA: Diagnosis present

## 2022-02-06 DIAGNOSIS — F4310 Post-traumatic stress disorder, unspecified: Secondary | ICD-10-CM | POA: Diagnosis present

## 2022-02-06 DIAGNOSIS — F909 Attention-deficit hyperactivity disorder, unspecified type: Secondary | ICD-10-CM | POA: Diagnosis present

## 2022-02-06 DIAGNOSIS — Z8249 Family history of ischemic heart disease and other diseases of the circulatory system: Secondary | ICD-10-CM

## 2022-02-06 DIAGNOSIS — Z91410 Personal history of adult physical and sexual abuse: Secondary | ICD-10-CM

## 2022-02-06 DIAGNOSIS — Z62810 Personal history of physical and sexual abuse in childhood: Secondary | ICD-10-CM

## 2022-02-06 MED ORDER — ACETAMINOPHEN 325 MG PO TABS
650.0000 mg | ORAL_TABLET | Freq: Four times a day (QID) | ORAL | Status: DC | PRN
  Administered 2022-02-06: 650 mg via ORAL
  Filled 2022-02-06: qty 2

## 2022-02-06 MED ORDER — ARIPIPRAZOLE 5 MG PO TABS
5.0000 mg | ORAL_TABLET | Freq: Every day | ORAL | Status: DC
  Administered 2022-02-06 – 2022-02-07 (×2): 5 mg via ORAL
  Filled 2022-02-06 (×2): qty 1

## 2022-02-06 MED ORDER — HYDROXYZINE HCL 50 MG PO TABS
50.0000 mg | ORAL_TABLET | Freq: Four times a day (QID) | ORAL | Status: DC | PRN
  Administered 2022-02-06: 50 mg via ORAL
  Filled 2022-02-06: qty 1

## 2022-02-06 MED ORDER — DOXYCYCLINE HYCLATE 100 MG PO TABS
100.0000 mg | ORAL_TABLET | Freq: Two times a day (BID) | ORAL | Status: DC
  Administered 2022-02-06 – 2022-02-07 (×2): 100 mg via ORAL
  Filled 2022-02-06 (×3): qty 1

## 2022-02-06 MED ORDER — TRAZODONE HCL 100 MG PO TABS
100.0000 mg | ORAL_TABLET | Freq: Every evening | ORAL | Status: DC | PRN
  Administered 2022-02-06: 100 mg via ORAL
  Filled 2022-02-06: qty 1

## 2022-02-06 MED ORDER — ACETAMINOPHEN 325 MG PO TABS
650.0000 mg | ORAL_TABLET | ORAL | Status: DC | PRN
  Administered 2022-02-06: 650 mg via ORAL
  Filled 2022-02-06: qty 2

## 2022-02-06 MED ORDER — MAGNESIUM HYDROXIDE 400 MG/5ML PO SUSP: 30.0000 mL | Freq: Every day | ORAL | Status: DC | PRN

## 2022-02-06 MED ORDER — ALUM & MAG HYDROXIDE-SIMETH 200-200-20 MG/5ML PO SUSP: 30.0000 mL | ORAL | Status: DC | PRN

## 2022-02-06 NOTE — ED Notes (Signed)
Attempted to call report to [facility], was told they would give me a call back in about 30 mins
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
## 2022-02-06 NOTE — Progress Notes (Signed)
Admission Note:   Report was received from Ariel, RN on a 21 year-old female who presents IVC in no acute distress for the treatment of making Suicidal threats. Patient appears anxious, worried, and slightly preoccupied with being in the hospital and not being home for Christmas. Patient was calm and cooperative with admission process. Patient denies any depression, but did endorse anxiety stating "being confined here", is why she's feeling this way. Patient also stated that when she was younger, she ran away from home and her Mother had her committed and stated that she was suicidal, which is another reason why she has anxiety about being here. Patient also denies SI/HI/AVH to this writer, stating " I would never take my life from my kids. I just wanted my Mom's attention, I just wanted to talk to her". Patient endorsed left, index finger pain, rating it a "10/10", in which she did request PRN pain medication from this Clinical research associate. Patient reports that she smashed her finger at work and she had to have it drained and is on antibiotics for it. Patient states that the dressing needs to be changed daily, and she last had it done overnight, and did not want this writer to assess nor re-dress her finger. Patient has past medical history of Bipolar disorder and Depression. Patient states that her goal for treatment is to "make a safety plan, with my parents, for when I leave". Skin was assessed with Lupita Leash, RN and found to be clear of any abnormal marks apart from multiple tattoos to bilateral upper/lower arms, anterior chest. Patient searched and no contraband found and unit policies explained and understanding verbalized. Consents obtained. Food and fluids offered, and both accepted. Patient had no additional questions or concerns to voice at this time. Patient remains safe on the unit.

## 2022-02-06 NOTE — ED Notes (Signed)
Pt received breakfast tray 

## 2022-02-06 NOTE — ED Provider Notes (Signed)
Emergency Medicine Observation Re-evaluation Note  Sandy Cox is a 21 y.o. female, seen on rounds today.  Pt initially presented to the ED for complaints of Mental Health Problem Currently, the patient is resting.  Physical Exam  BP 98/64 (BP Location: Left Arm)   Pulse (!) 106   Temp 98.2 F (36.8 C) (Oral)   Resp 17   Ht 1.651 m (5\' 5" )   Wt 68 kg   LMP 02/04/2022 (Exact Date)   SpO2 95%   BMI 24.95 kg/m  Physical Exam Gen:  No acute distress Resp:  Breathing easily and comfortably, no accessory muscle usage Neuro:  Moving all four extremities, no gross focal neuro deficits Psych:  Resting currently, calm when awake ED Course / MDM  EKG:   I have reviewed the labs performed to date as well as medications administered while in observation.  Recent changes in the last 24 hours include plan to admit to the [facility] behavioral health unit this morning.  Plan  Current plan is for admission to the [facility] behavioral health unit after 8 AM today.    [facility], MD 02/06/22 [phone]
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
## 2022-02-06 NOTE — ED Notes (Signed)
Pt resting in bed with eyes closed. Respirations even and unlabored.

## 2022-02-06 NOTE — Plan of Care (Signed)
New admission.  Problem: Education: Goal: Knowledge of General Education information will improve Description: Including pain rating scale, medication(s)/side effects and non-pharmacologic comfort measures Outcome: Not Progressing   Problem: Health Behavior/Discharge Planning: Goal: Ability to manage health-related needs will improve Outcome: Not Progressing   Problem: Clinical Measurements: Goal: Ability to maintain clinical measurements within normal limits will improve Outcome: Not Progressing Goal: Will remain free from infection Outcome: Not Progressing Goal: Diagnostic test results will improve Outcome: Not Progressing Goal: Respiratory complications will improve Outcome: Not Progressing Goal: Cardiovascular complication will be avoided Outcome: Not Progressing   Problem: Activity: Goal: Risk for activity intolerance will decrease Outcome: Not Progressing   Problem: Nutrition: Goal: Adequate nutrition will be maintained Outcome: Not Progressing   Problem: Coping: Goal: Level of anxiety will decrease Outcome: Not Progressing   Problem: Elimination: Goal: Will not experience complications related to bowel motility Outcome: Not Progressing Goal: Will not experience complications related to urinary retention Outcome: Not Progressing   Problem: Pain Managment: Goal: General experience of comfort will improve Outcome: Not Progressing   Problem: Safety: Goal: Ability to remain free from injury will improve Outcome: Not Progressing   Problem: Skin Integrity: Goal: Risk for impaired skin integrity will decrease Outcome: Not Progressing   Problem: Education: Goal: Knowledge of [place] [facility] Education information/materials will improve Outcome: Not Progressing Goal: Emotional status will improve Outcome: Not Progressing Goal: Mental status will improve Outcome: Not Progressing Goal: Verbalization of understanding the information provided will  improve Outcome: Not Progressing   Problem: Safety: Goal: Periods of time without injury will increase Outcome: Not Progressing   Problem: Education: Goal: Ability to make informed decisions regarding treatment will improve Outcome: Not Progressing   Problem: Health Behavior/Discharge Planning: Goal: Identification of resources available to assist in meeting health care needs will improve Outcome: Not Progressing   Problem: Self-Concept: Goal: Ability to disclose and discuss suicidal ideas will improve Outcome: Not Progressing Goal: Will verbalize positive feelings about self Outcome: Not Progressing   Problem: Self-Concept: Goal: Level of anxiety will decrease Outcome: Not Progressing Goal: Ability to modify response to factors that promote anxiety will improve Outcome: Not Progressing

## 2022-02-06 NOTE — Tx Team (Signed)
Initial Treatment Plan 02/06/2022 3:47 PM Sandy Cox LOV:[id number]    PATIENT STRESSORS: Marital or family conflict   Substance abuse     PATIENT STRENGTHS: Capable of independent living  General fund of knowledge  Supportive family/friends  Work skills    PATIENT IDENTIFIED PROBLEMS: Suicidal threats prior to admission  Anxiety  Family conflict  Substance abuse               DISCHARGE CRITERIA:  Ability to meet basic life and health needs Improved stabilization in mood, thinking, and/or behavior Need for constant or close observation no longer present Reduction of life-threatening or endangering symptoms to within safe limits  PRELIMINARY DISCHARGE PLAN: Outpatient therapy Return to previous living arrangement Return to previous work or school arrangements  PATIENT/FAMILY INVOLVEMENT: This treatment plan has been presented to and reviewed with the patient, Sandy Cox. The patient has been given the opportunity to ask questions and make suggestions.  Rheta Hemmelgarn, RN 02/06/2022, 3:47 PM

## 2022-02-07 DIAGNOSIS — F332 Major depressive disorder, recurrent severe without psychotic features: Secondary | ICD-10-CM | POA: Diagnosis not present

## 2022-02-07 MED ORDER — TRAZODONE HCL 100 MG PO TABS: 100.0000 mg | ORAL_TABLET | Freq: Every evening | ORAL | 1 refills | Status: DC | PRN

## 2022-02-07 MED ORDER — ARIPIPRAZOLE 5 MG PO TABS: 5.0000 mg | ORAL_TABLET | Freq: Every day | ORAL | 0 refills | Status: DC

## 2022-02-07 MED ORDER — TRAZODONE HCL 100 MG PO TABS: 100.0000 mg | ORAL_TABLET | Freq: Every evening | ORAL | 1 refills | Status: AC | PRN

## 2022-02-07 NOTE — Progress Notes (Signed)
  [facility] Adult Case Management Discharge Plan :  Will you be returning to the same living situation after discharge:  No. At discharge, do you have transportation home?: Yes,  family to provide transportation home.  Do you have the ability to pay for your medications: Yes,  [place] Medicaid [employer] .  Release of information consent forms completed and in the chart;  Patient's signature needed at discharge.  Patient to Follow up at:  Follow-up Information     [facility] Follow up.   Why: They have walk-in hours on Mondays, Wednesdays, and Fridays from 8am-4pm. As clients are seen on a first come, first served basis it is recommended to show up prior to 7:30am in order to be seen the same day. Thanks! Contact information: [address] [place] [address] [phone]                 Next level of care provider has access to [facility] Link:no  Safety Planning and Suicide Prevention discussed: Yes,  SPE completed with pt.     Has patient been referred to the Quitline?: Patient refused referral  Patient has been referred for addiction treatment: Yes  Glenis Smoker, LCSW 02/07/2022, 10:23 AM

## 2022-02-07 NOTE — [facility] Suicide Risk Assessment (Signed)
[facility] INPATIENT:  Family/Significant Other Suicide Prevention Education  Suicide Prevention Education:  Patient Refusal for Family/Significant Other Suicide Prevention Education: The patient Sandy Cox has refused to provide written consent for family/significant other to be provided Family/Significant Other Suicide Prevention Education during admission and/or prior to discharge.  Physician notified.  SPE completed with pt, as pt refused to consent to family contact. SPI pamphlet provided to pt and pt was encouraged to share information with support network, ask questions, and talk about any concerns relating to SPE. Pt denies access to guns/firearms and verbalized understanding of information provided. Mobile Crisis information also provided to pt.  Glenis Smoker 02/07/2022, 10:23 AM

## 2022-02-07 NOTE — BHH Counselor (Addendum)
CSW met with pt, pt family (father, mother, and boyfriend), and provider to discuss discharge and aftercare plans. Family endorsed desire to take pt home. They stated that pt will be under watchful eyes as she will be staying with her paternal uncle upon discharge. Family stated that on Tuesday they will get her to whatever appointments that they need to take her to. Provider reviewed options for continued treatment including residential and outpatient substance abuse treatment. She was encouraged to identify what she wanted. Pt endorsed interest in outpatient treatment. Parents shared that in approximately two or three weeks pt will be moving to [place] with them to continue recovery as they have several options for continued treatment there with which they are familiar. Pt has Medicaid [employer] and requests resources. CSW agreed to this. Medication to be sent to [facility] on [address]. No other concerns expressed. Contact ended without incident.   Chalmers Guest. Guerry Bruin, MSW, LCSW, [place] 02/07/2022 10:22 AM

## 2022-02-07 NOTE — Progress Notes (Signed)
Patient ID: Sandy Cox, female   DOB: 2000/07/13, 21 y.o.   MRN: [id number] Patient denies SI/HI/AVH. Belongings were returned to patient. Discharge instructions  including medication and follow up information were reviewed with patient and understanding was verbalized. Patient was not observed to be in distress at time of discharge. Patient was escorted out with staff to medical mall to be transported home by family member.
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
## 2022-02-07 NOTE — Plan of Care (Signed)
  Problem: Education: Goal: Knowledge of General Education information will improve Description: Including pain rating scale, medication(s)/side effects and non-pharmacologic comfort measures Outcome: Adequate for Discharge   Problem: Health Behavior/Discharge Planning: Goal: Ability to manage health-related needs will improve Outcome: Adequate for Discharge   Problem: Clinical Measurements: Goal: Ability to maintain clinical measurements within normal limits will improve Outcome: Adequate for Discharge Goal: Will remain free from infection Outcome: Adequate for Discharge Goal: Diagnostic test results will improve Outcome: Adequate for Discharge Goal: Respiratory complications will improve Outcome: Adequate for Discharge Goal: Cardiovascular complication will be avoided Outcome: Adequate for Discharge   Problem: Activity: Goal: Risk for activity intolerance will decrease Outcome: Adequate for Discharge   Problem: Nutrition: Goal: Adequate nutrition will be maintained Outcome: Adequate for Discharge   Problem: Coping: Goal: Level of anxiety will decrease Outcome: Adequate for Discharge   Problem: Elimination: Goal: Will not experience complications related to bowel motility Outcome: Adequate for Discharge Goal: Will not experience complications related to urinary retention Outcome: Adequate for Discharge   Problem: Pain Managment: Goal: General experience of comfort will improve Outcome: Adequate for Discharge   Problem: Safety: Goal: Ability to remain free from injury will improve Outcome: Adequate for Discharge   Problem: Skin Integrity: Goal: Risk for impaired skin integrity will decrease Outcome: Adequate for Discharge   Problem: Education: Goal: Knowledge of [place] [facility] Education information/materials will improve 02/07/2022 1008 by Hyman Hopes, RN Outcome: Adequate for Discharge 02/07/2022 8527 by Hyman Hopes, RN Outcome:  Progressing Goal: Emotional status will improve Outcome: Adequate for Discharge Goal: Mental status will improve Outcome: Adequate for Discharge Goal: Verbalization of understanding the information provided will improve 02/07/2022 1008 by Hyman Hopes, RN Outcome: Adequate for Discharge 02/07/2022 7824 by Hyman Hopes, RN Outcome: Progressing   Problem: Safety: Goal: Periods of time without injury will increase 02/07/2022 1008 by Hyman Hopes, RN Outcome: Adequate for Discharge 02/07/2022 2353 by Hyman Hopes, RN Outcome: Progressing   Problem: Education: Goal: Ability to make informed decisions regarding treatment will improve Outcome: Adequate for Discharge   Problem: Health Behavior/Discharge Planning: Goal: Identification of resources available to assist in meeting health care needs will improve Outcome: Adequate for Discharge   Problem: Self-Concept: Goal: Ability to disclose and discuss suicidal ideas will improve Outcome: Adequate for Discharge Goal: Will verbalize positive feelings about self Outcome: Adequate for Discharge   Problem: Self-Concept: Goal: Level of anxiety will decrease 02/07/2022 1008 by Hyman Hopes, RN Outcome: Adequate for Discharge 02/07/2022 [id number] by Hyman Hopes, RN Outcome: Progressing Goal: Ability to modify response to factors that promote anxiety will improve Outcome: Adequate for Discharge
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
## 2022-05-07 IMAGING — DX DG CHEST 1V PORT
1 series · 1 of 1 positions shown · non-contrast
Comparison: None

CLINICAL DATA: Chest pain

EXAM:
PORTABLE CHEST 1 VIEW

[chest ap]
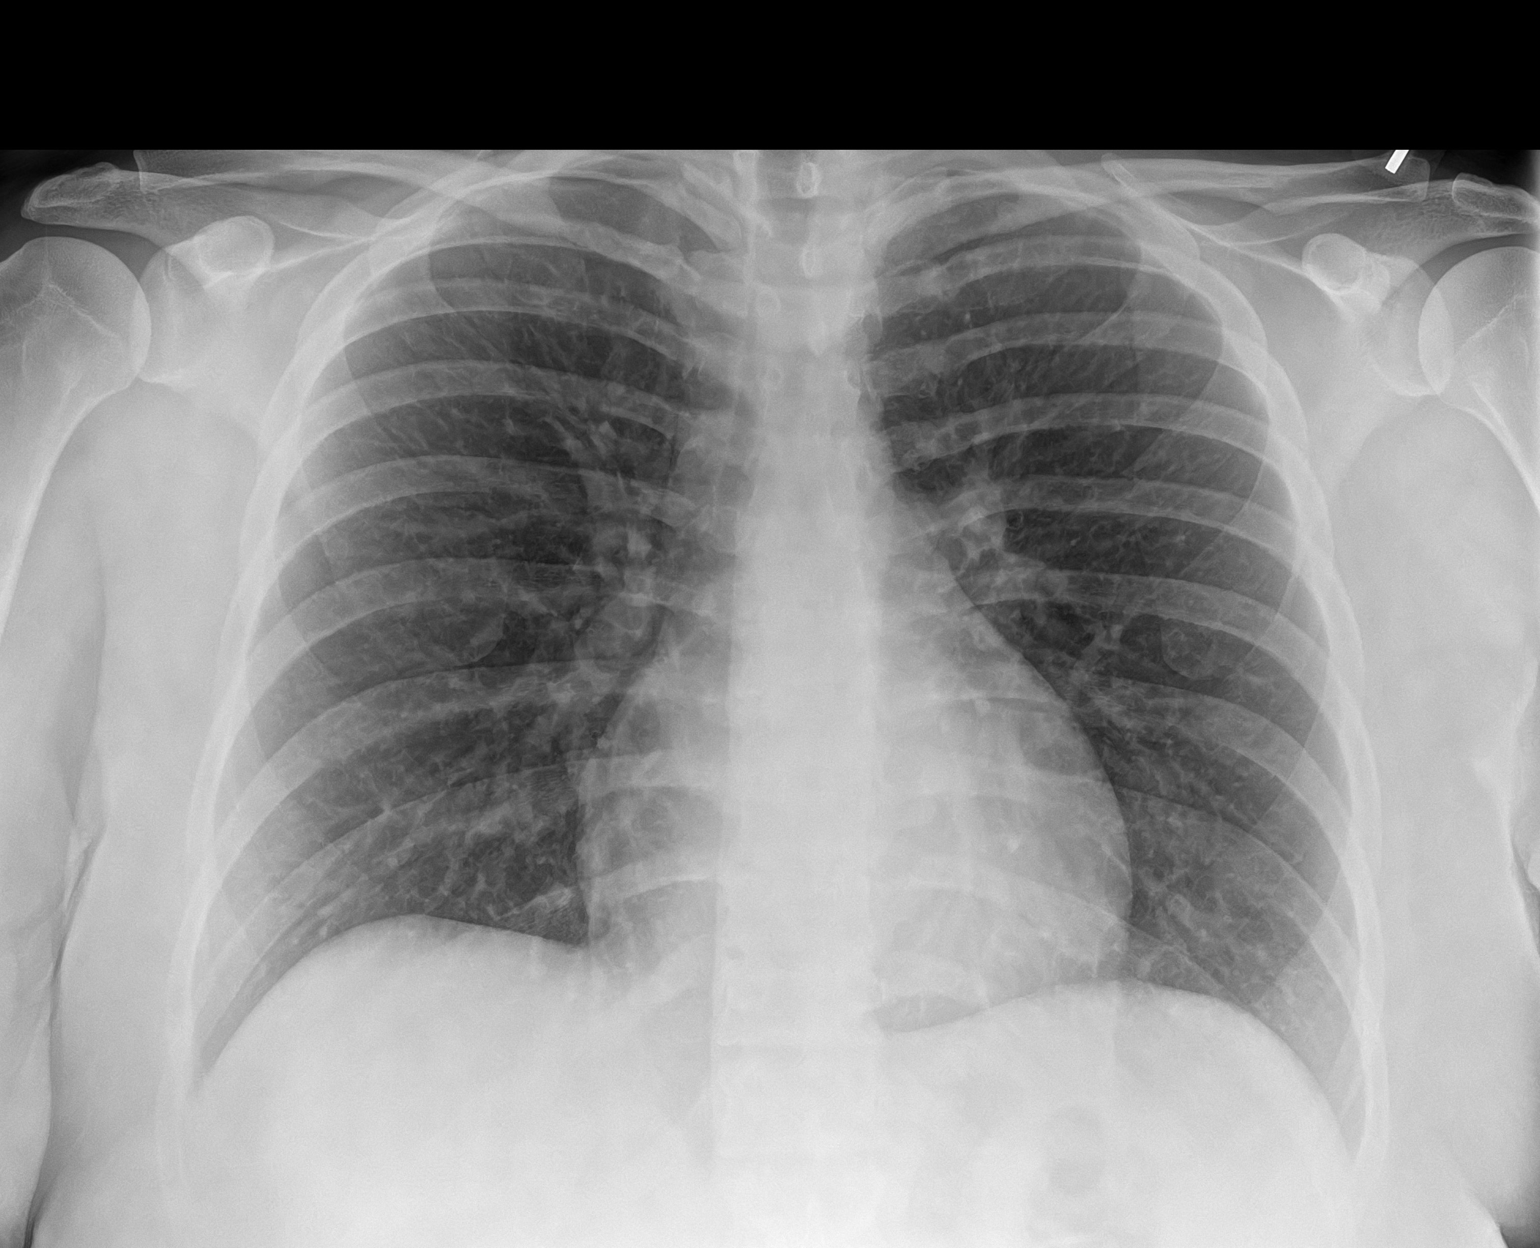

[1 of 1 positions shown; findings below may reference images not displayed]

FINDINGS: Lungs are clear. Heart size and pulmonary vascularity are normal. No
adenopathy. No pneumothorax. No bone lesions.
IMPRESSION: Lungs clear.  Cardiac silhouette normal.

## 2022-05-28 ENCOUNTER — Ambulatory Visit: Payer: Medicaid Other

## 2022-06-15 ENCOUNTER — Ambulatory Visit: Payer: Medicaid Other

## 2022-06-25 ENCOUNTER — Ambulatory Visit: Payer: Medicaid Other | Admitting: Nurse Practitioner

## 2022-06-28 ENCOUNTER — Encounter: Payer: Self-pay | Admitting: Emergency Medicine

## 2022-06-28 ENCOUNTER — Emergency Department
Admission: EM | Admit: 2022-06-28 | Discharge: 2022-06-28 | Disposition: A | Discharge status: Home / Self Care | Payer: Medicaid Other | Attending: Emergency Medicine | Admitting: Emergency Medicine

## 2022-06-28 ENCOUNTER — Emergency Department: Payer: Medicaid Other

## 2022-06-28 ENCOUNTER — Other Ambulatory Visit: Payer: Self-pay

## 2022-06-28 DIAGNOSIS — Z87891 Personal history of nicotine dependence: Secondary | ICD-10-CM | POA: Diagnosis not present

## 2022-06-28 DIAGNOSIS — N12 Tubulo-interstitial nephritis, not specified as acute or chronic: Secondary | ICD-10-CM | POA: Insufficient documentation

## 2022-06-28 DIAGNOSIS — R109 Unspecified abdominal pain: Secondary | ICD-10-CM | POA: Diagnosis present

## 2022-06-28 LAB — COMPREHENSIVE METABOLIC PANEL
ALT: 18 U/L (ref 0–44)
AST: 24 U/L (ref 15–41)
Albumin: 3.1 g/dL — ABNORMAL LOW (ref 3.5–5.0)
Alkaline Phosphatase: 52 U/L (ref 38–126)
Anion gap: 11 (ref 5–15)
BUN: 10 mg/dL (ref 6–20)
CO2: 23 mmol/L (ref 22–32)
Calcium: 8.1 mg/dL — ABNORMAL LOW (ref 8.9–10.3)
Chloride: 101 mmol/L (ref 98–111)
Creatinine, Ser: 1.02 mg/dL — ABNORMAL HIGH (ref 0.44–1.00)
GFR, Estimated: >60 mL/min (ref >60)
Glucose, Bld: 111 mg/dL — ABNORMAL HIGH (ref 70–99)
Potassium: 3.3 mmol/L — ABNORMAL LOW (ref 3.5–5.1)
Sodium: 135 mmol/L (ref 135–145)
Total Bilirubin: 1 mg/dL (ref 0.3–1.2)
Total Protein: 6 g/dL — ABNORMAL LOW (ref 6.5–8.1)

## 2022-06-28 LAB — URINALYSIS, MICROSCOPIC (REFLEX)
Bacteria, UA: NONE SEEN
WBC, UA: >50 WBC/hpf (ref 0–5)

## 2022-06-28 LAB — URINALYSIS, ROUTINE W REFLEX MICROSCOPIC
Bilirubin Urine: NEGATIVE
Glucose, UA: NEGATIVE mg/dL
Ketones, ur: NEGATIVE mg/dL
Nitrite: POSITIVE — AB
Protein, ur: 100 mg/dL — AB
Specific Gravity, Urine: 1.015 (ref 1.005–1.030)
pH: 7 (ref 5.0–8.0)

## 2022-06-28 LAB — CBC
HCT: 39 % (ref 36.0–46.0)
Hemoglobin: 13.2 g/dL (ref 12.0–15.0)
MCH: 30.3 pg (ref 26.0–34.0)
MCHC: 33.8 g/dL (ref 30.0–36.0)
MCV: 89.7 fL (ref 80.0–100.0)
Platelets: 257 10*3/uL (ref 150–400)
RBC: 4.35 MIL/uL (ref 3.87–5.11)
RDW: 12.6 % (ref 11.5–15.5)
WBC: 21 10*3/uL — ABNORMAL HIGH (ref 4.0–10.5)
nRBC: 0 % (ref 0.0–0.2)

## 2022-06-28 LAB — HIV ANTIBODY (ROUTINE TESTING W REFLEX): HIV Screen 4th Generation wRfx: NONREACTIVE

## 2022-06-28 LAB — LIPASE, BLOOD: Lipase: 31 U/L (ref 11–51)

## 2022-06-28 LAB — WET PREP, GENITAL
Clue Cells Wet Prep HPF POC: NONE SEEN
Sperm: NONE SEEN
Trich, Wet Prep: NONE SEEN
WBC, Wet Prep HPF POC: <10 (ref <10)
Yeast Wet Prep HPF POC: NONE SEEN

## 2022-06-28 LAB — CHLAMYDIA/NGC RT PCR (ARMC ONLY)
Chlamydia Tr: NOT DETECTED
N gonorrhoeae: NOT DETECTED

## 2022-06-28 LAB — POC URINE PREG, ED: Preg Test, Ur: NEGATIVE

## 2022-06-28 MED ORDER — ONDANSETRON HCL 4 MG/2ML IJ SOLN
4.0000 mg | Freq: Once | INTRAMUSCULAR | Status: AC
  Administered 2022-06-28: 4 mg via INTRAVENOUS
  Filled 2022-06-28: qty 2

## 2022-06-28 MED ORDER — CEFDINIR 300 MG PO CAPS: 300.0000 mg | ORAL_CAPSULE | Freq: Two times a day (BID) | ORAL | 0 refills | Status: AC

## 2022-06-28 MED ORDER — SODIUM CHLORIDE 0.9 % IV SOLN
1.0000 g | INTRAVENOUS | Status: DC
  Administered 2022-06-28: 1 g via INTRAVENOUS
  Filled 2022-06-28: qty 10

## 2022-06-28 MED ORDER — SODIUM CHLORIDE 0.9 % IV BOLUS
1000.0000 mL | Freq: Once | INTRAVENOUS | Status: AC
  Administered 2022-06-28: 1000 mL via INTRAVENOUS

## 2022-06-28 MED ORDER — IOHEXOL 300 MG/ML  SOLN
100.0000 mL | Freq: Once | INTRAMUSCULAR | Status: AC | PRN
  Administered 2022-06-28: 100 mL via INTRAVENOUS

## 2022-06-28 MED ORDER — KETOROLAC TROMETHAMINE 15 MG/ML IJ SOLN
15.0000 mg | Freq: Once | INTRAMUSCULAR | Status: AC
  Administered 2022-06-28: 15 mg via INTRAVENOUS
  Filled 2022-06-28: qty 1
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
## 2022-06-28 NOTE — Discharge Instructions (Signed)
You have an infection that started as a urinary tract infection and has since likely gone to your kidney.  Please take the antibiotics as prescribed.  Please return for any new, worsening, or change in symptoms or other concerns.  It was a pleasure caring for you today.

## 2022-06-28 NOTE — ED Triage Notes (Signed)
Pt to ED for generalized abd pain started yesterday. Reports was walking when it started and passed out for 30 mins. Also reports headache.  Ambulatory to triage, NAD noted.

## 2022-06-29 LAB — RPR: RPR Ser Ql: NONREACTIVE

## 2022-11-14 IMAGING — US US MFM OB FOLLOW-UP
1 series · 14 of 28 positions shown · non-contrast
Comparison: none

[Series 1: us mfm ob follow-up · 45 acquisitions, 14 frames shown]
[im 2/45]
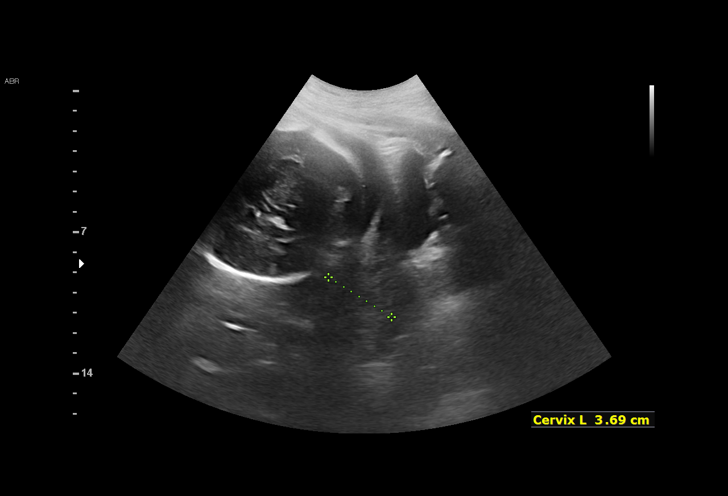
[im 5/45]
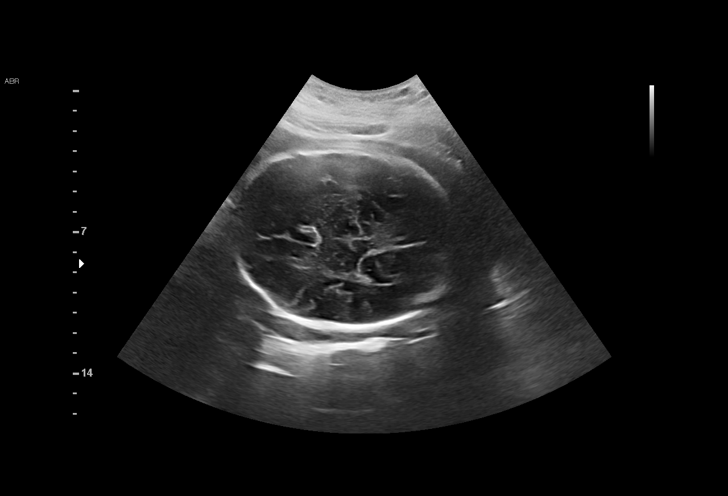
[im 9/45]
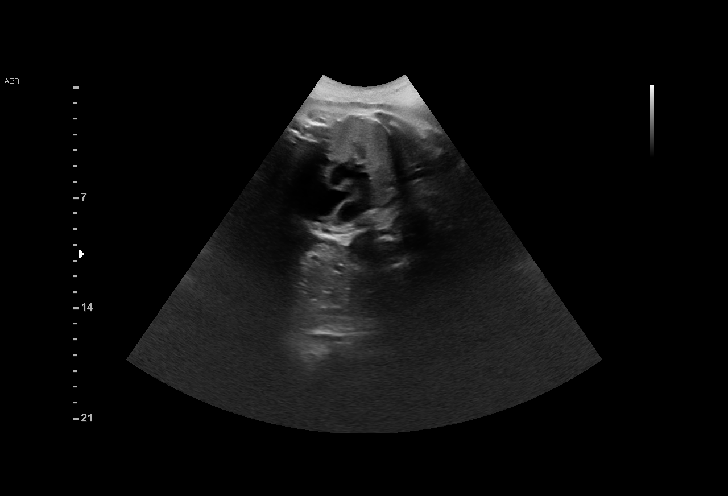
[im 12/45]
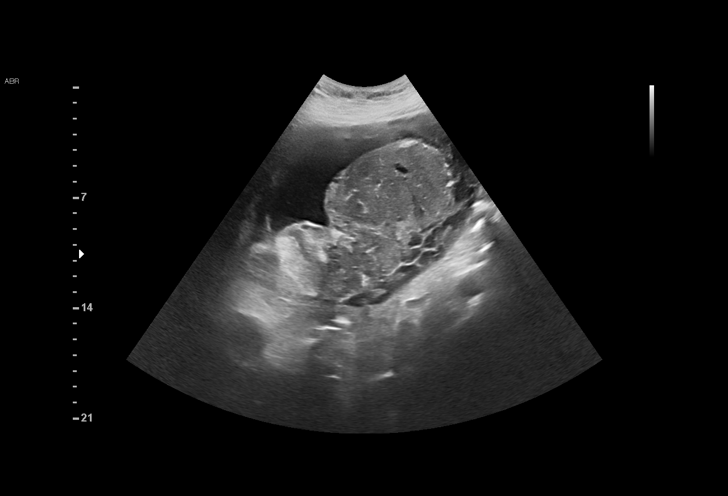
[im 15/45]
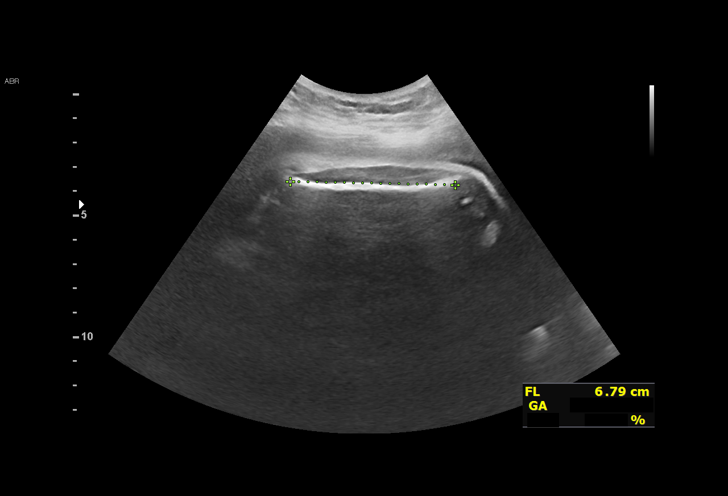
[im 18/45]
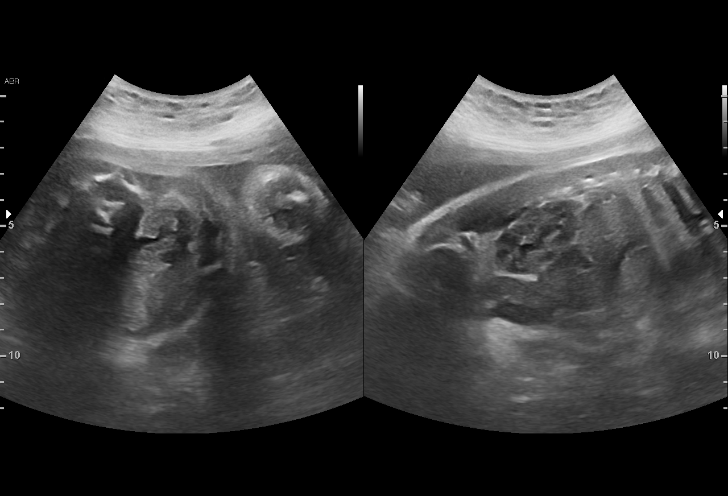
[im 22/45]
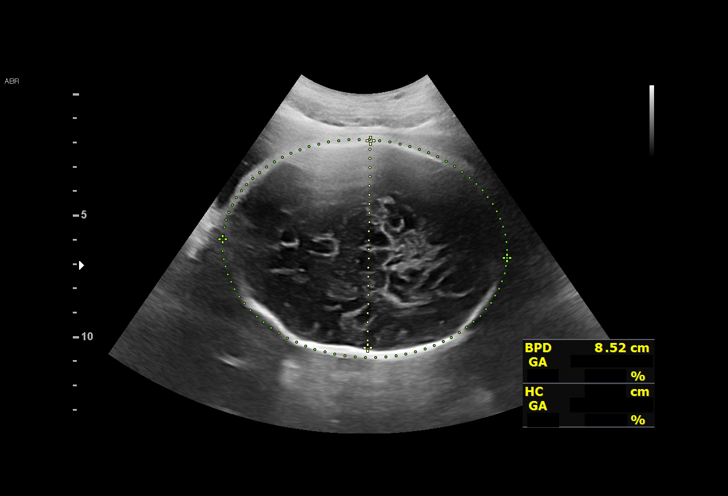
[im 25/45]
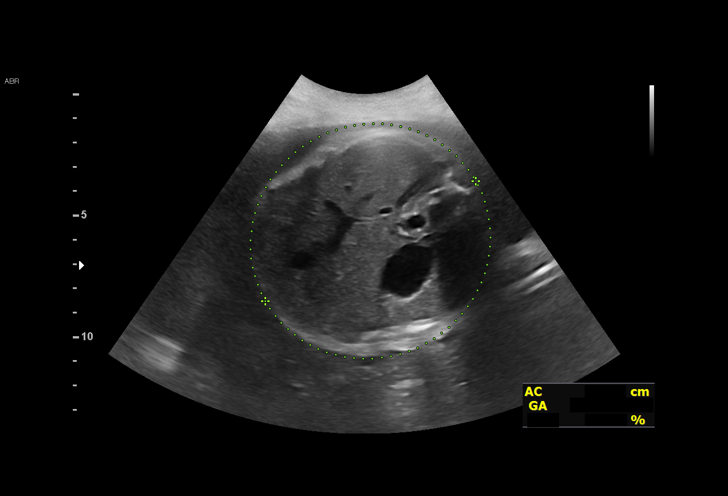
[im 28/45]
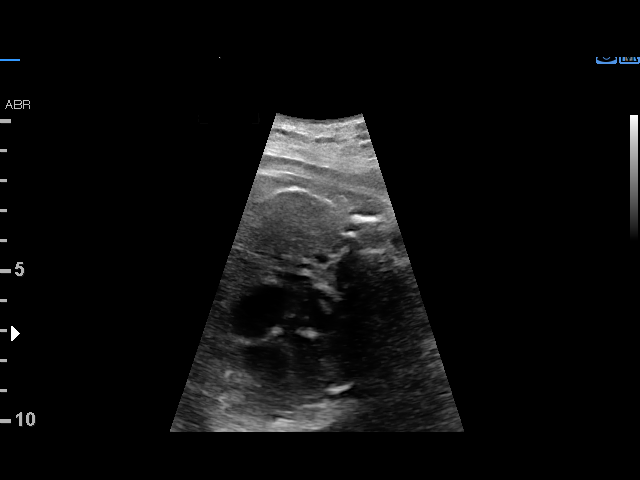
[im 31/45]
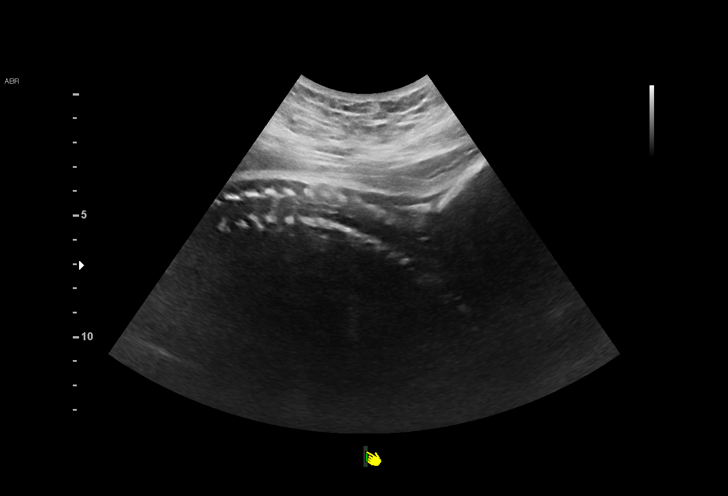
[im 35/45]
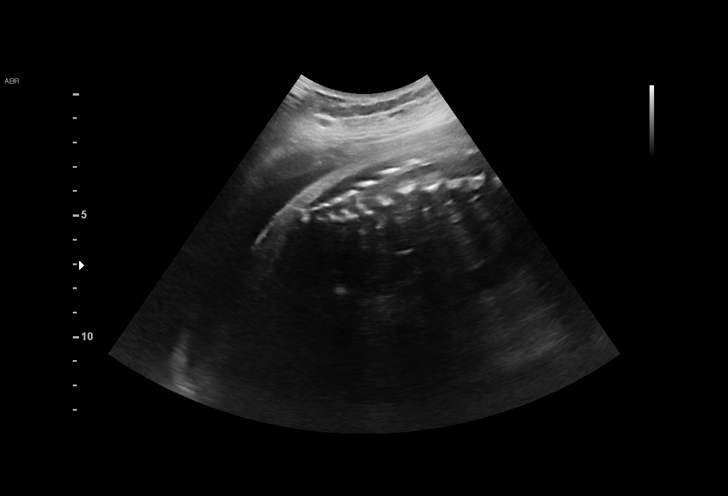
[im 38/45]
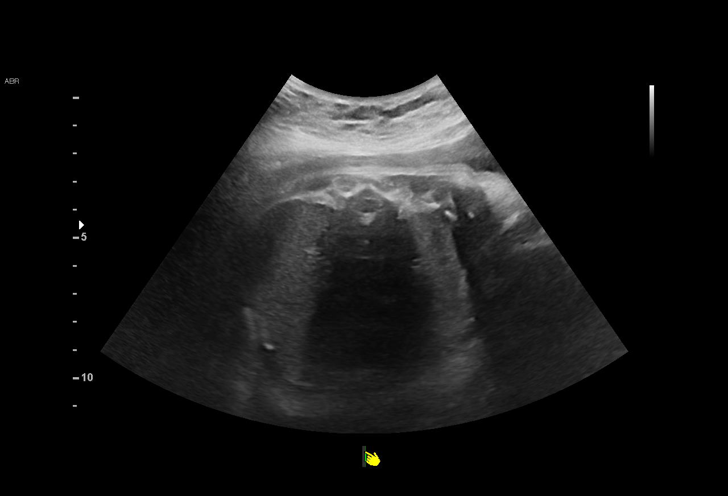
[im 41/45]
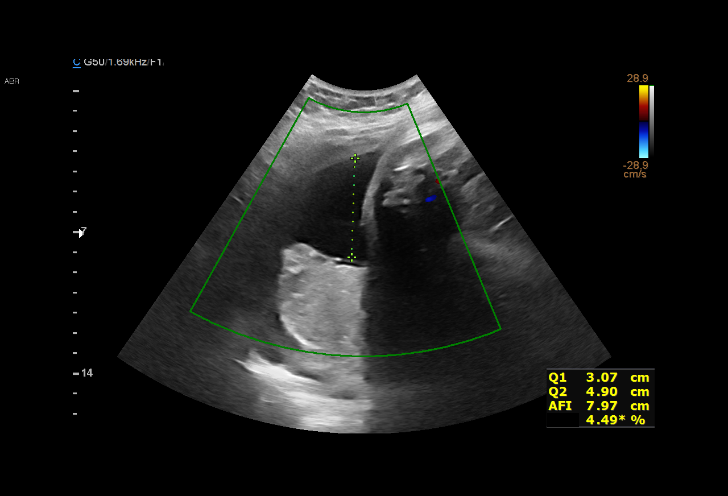
[im 45/45]
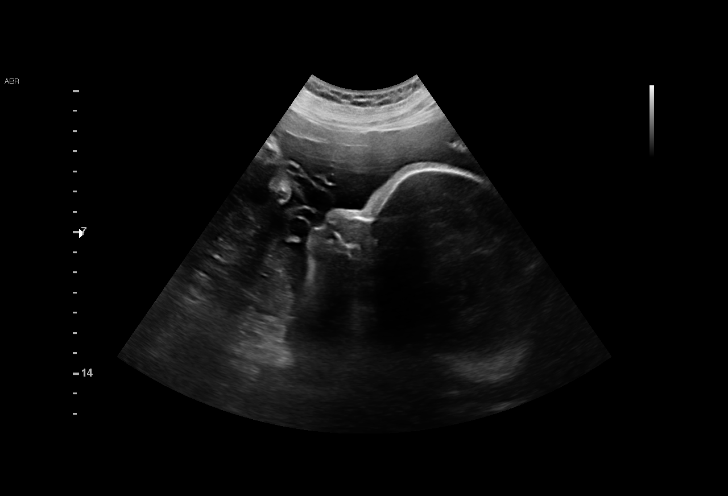

[14 of 28 positions shown; findings below may reference images not displayed]

BINO CNM

Indications

 33 weeks gestation of pregnancy
 Obesity complicating pregnancy, third
 trimester
 Macrosomia (Previous Pregnancy)
Fetal Evaluation

 Num Of Fetuses:         1
 Fetal Heart Rate(bpm):  143
 Cardiac Activity:       Observed
 Presentation:           Cephalic
 Placenta:               Posterior
 P. Cord Insertion:      Previously Visualized

 AFI Sum(cm)     %Tile       Largest Pocket(cm)
 17.7            65

 RUQ(cm)       RLQ(cm)       LUQ(cm)        LLQ(cm)

Biometry

 BPD:      85.1  mm     G. Age:  34w 2d         64  %    CI:        68.03   %    70 - 86
                                                         FL/HC:      20.7   %    19.4 -
 HC:       330   mm     G. Age:  37w 4d         95  %    HC/AC:      1.07        0.96 -
 AC:      308.5  mm     G. Age:  34w 6d         82  %    FL/BPD:     80.4   %    71 - 87
 FL:       68.4  mm     G. Age:  35w 1d         77  %    FL/AC:      22.2   %    20 - 24
 HUM:      59.9  mm     G. Age:  34w 6d         78  %
 LV:        3.9  mm
 Est. FW:    9480  gm    5 lb 12 oz      83  %
OB History

 Gravidity:    2         Term:   1        Prem:   0        SAB:   0
 TOP:          0       Ectopic:  0        Living: 1
Gestational Age

 LMP:           38w 0d        Date:  11/15/19                 EDD:   08/21/20
 U/S Today:     35w 3d                                        EDD:   09/08/20
 Best:          33w 5d     Det. By:  Early Ultrasound         EDD:   09/20/20
                                     (01/29/20)
Anatomy

 Cranium:               Previously seen        Aortic Arch:            Previously seen
 Cavum:                 Previously seen        Ductal Arch:            Previously seen
 Ventricles:            Appears normal         Diaphragm:              Previously seen
 Choroid Plexus:        Previously seen        Stomach:                Appears normal, left
                                                                       sided
 Cerebellum:            Previously seen        Abdomen:                Previously seen
 Posterior Fossa:       Previously seen        Abdominal Wall:         Previously seen
 Nuchal Fold:           Previously seen        Cord Vessels:           Previously seen
 Face:                  Orbits and profile     Kidneys:                Appear normal
                        previously seen
 Lips:                  Previously seen        Bladder:                Appears normal
 Heart:                 Appears normal         Spine:                  Appears normal
                        (4CH, axis, and
                        situs)
 RVOT:                  Appears normal         Upper Extremities:      Previously seen
 LVOT:                  Previously seen        Lower Extremities:      Previously seen
Cervix Uterus Adnexa

 Cervix
 Length:           3.69  cm.
Impression

 Follow up growth due to elevated maternal BMI
 Normal interval growth with measurements consistent with
 dates
 Good fetal movement and amniotic fluid volume
Recommendations

 Follow up growth in 4 weeks
 Initiate weekly testing at 36 weeks.

## 2023-10-26 ENCOUNTER — Emergency Department: Payer: MEDICAID

## 2023-10-26 ENCOUNTER — Emergency Department
Admission: EM | Admit: 2023-10-26 | Discharge: 2023-10-26 | Disposition: A | Discharge status: Home / Self Care | Payer: MEDICAID | Attending: Emergency Medicine | Admitting: Emergency Medicine

## 2023-10-26 ENCOUNTER — Ambulatory Visit
Admission: EM | Admit: 2023-10-26 | Discharge: 2023-10-26 | Disposition: A | Discharge status: Home / Self Care | Source: Ambulatory Visit | Attending: Emergency Medicine | Admitting: Emergency Medicine

## 2023-10-26 ENCOUNTER — Other Ambulatory Visit: Payer: Self-pay

## 2023-10-26 DIAGNOSIS — F319 Bipolar disorder, unspecified: Secondary | ICD-10-CM | POA: Diagnosis not present

## 2023-10-26 DIAGNOSIS — Z0441 Encounter for examination and observation following alleged adult rape: Secondary | ICD-10-CM | POA: Insufficient documentation

## 2023-10-26 DIAGNOSIS — N10 Acute tubulo-interstitial nephritis: Principal | ICD-10-CM | POA: Insufficient documentation

## 2023-10-26 DIAGNOSIS — T7411XA Adult physical abuse, confirmed, initial encounter: Secondary | ICD-10-CM | POA: Diagnosis not present

## 2023-10-26 DIAGNOSIS — F19920 Other psychoactive substance use, unspecified with intoxication, uncomplicated: Secondary | ICD-10-CM | POA: Diagnosis not present

## 2023-10-26 DIAGNOSIS — S0512XA Contusion of eyeball and orbital tissues, left eye, initial encounter: Secondary | ICD-10-CM | POA: Insufficient documentation

## 2023-10-26 DIAGNOSIS — T7421XA Adult sexual abuse, confirmed, initial encounter: Secondary | ICD-10-CM | POA: Insufficient documentation

## 2023-10-26 DIAGNOSIS — R109 Unspecified abdominal pain: Secondary | ICD-10-CM | POA: Diagnosis present

## 2023-10-26 LAB — CBC
HCT: 39.1 % (ref 36.0–46.0)
Hemoglobin: 13.3 g/dL (ref 12.0–15.0)
MCH: 30.4 pg (ref 26.0–34.0)
MCHC: 34 g/dL (ref 30.0–36.0)
MCV: 89.3 fL (ref 80.0–100.0)
Platelets: 320 K/uL (ref 150–400)
RBC: 4.38 MIL/uL (ref 3.87–5.11)
RDW: 12.2 % (ref 11.5–15.5)
WBC: 8.5 K/uL (ref 4.0–10.5)
nRBC: 0 % (ref 0.0–0.2)

## 2023-10-26 LAB — HEPATITIS B SURFACE ANTIGEN: Hepatitis B Surface Ag: NONREACTIVE

## 2023-10-26 LAB — COMPREHENSIVE METABOLIC PANEL WITH GFR
ALT: 17 U/L (ref 0–44)
AST: 22 U/L (ref 15–41)
Albumin: 4.2 g/dL (ref 3.5–5.0)
Alkaline Phosphatase: 41 U/L (ref 38–126)
Anion gap: 9 (ref 5–15)
BUN: 16 mg/dL (ref 6–20)
CO2: 25 mmol/L (ref 22–32)
Calcium: 9.1 mg/dL (ref 8.9–10.3)
Chloride: 104 mmol/L (ref 98–111)
Creatinine, Ser: 0.8 mg/dL (ref 0.44–1.00)
GFR, Estimated: >60 mL/min (ref >60)
Glucose, Bld: 99 mg/dL (ref 70–99)
Potassium: 3.8 mmol/L (ref 3.5–5.1)
Sodium: 138 mmol/L (ref 135–145)
Total Bilirubin: 0.7 mg/dL (ref 0.0–1.2)
Total Protein: 7.7 g/dL (ref 6.5–8.1)

## 2023-10-26 LAB — CHLAMYDIA/NGC RT PCR (ARMC ONLY)
Chlamydia Tr: DETECTED — AB
N gonorrhoeae: DETECTED — AB

## 2023-10-26 LAB — ETHANOL: Alcohol, Ethyl (B): <15 mg/dL (ref <15)

## 2023-10-26 LAB — RAPID HIV SCREEN (HIV 1/2 AB+AG)
HIV 1/2 Antibodies: NONREACTIVE
HIV-1 P24 Antigen - HIV24: NONREACTIVE

## 2023-10-26 LAB — HEPATITIS C ANTIBODY: HCV Ab: NONREACTIVE

## 2023-10-26 MED ORDER — AZITHROMYCIN 500 MG PO TABS
1000.0000 mg | ORAL_TABLET | Freq: Once | ORAL | Status: AC
  Administered 2023-10-26: 1000 mg via ORAL
  Filled 2023-10-26: qty 2

## 2023-10-26 MED ORDER — KETOROLAC TROMETHAMINE 30 MG/ML IJ SOLN
30.0000 mg | Freq: Once | INTRAMUSCULAR | Status: AC
  Administered 2023-10-26: 30 mg via INTRAVENOUS
  Filled 2023-10-26: qty 1

## 2023-10-26 MED ORDER — CEFTRIAXONE SODIUM 1 G IJ SOLR
500.0000 mg | Freq: Once | INTRAMUSCULAR | Status: AC
  Administered 2023-10-26: 500 mg via INTRAMUSCULAR
  Filled 2023-10-26: qty 10

## 2023-10-26 MED ORDER — METRONIDAZOLE 500 MG PO TABS
2000.0000 mg | ORAL_TABLET | Freq: Once | ORAL | Status: AC
  Administered 2023-10-26: 2000 mg via ORAL
  Filled 2023-10-26: qty 4

## 2023-10-26 MED ORDER — BICTEGRAVIR-EMTRICITAB-TENOFOV 50-200-25 MG PO PREPACK
1.0000 | ORAL_TABLET | Freq: Every day | ORAL | Status: DC
  Administered 2023-10-26: 1 via ORAL
  Filled 2023-10-26: qty 1

## 2023-10-26 MED ORDER — BICTEGRAVIR-EMTRICITAB-TENOFOV 50-200-25 MG PO TABS
1.0000 | ORAL_TABLET | Freq: Every day | ORAL | 0 refills | Status: DC
Start: 2023-10-26 — End: 2023-11-25
  Filled 2023-10-26 – 2023-10-27 (×2): qty 30, 30d supply, fill #0

## 2023-10-26 MED ORDER — BICTEGRAVIR-EMTRICITAB-TENOFOV 50-200-25 MG PO PREPACK: 1.0000 | ORAL_TABLET | Freq: Every day | ORAL | Status: DC

## 2023-10-26 MED ORDER — ULIPRISTAL ACETATE 30 MG PO TABS
30.0000 mg | ORAL_TABLET | Freq: Once | ORAL | Status: AC
  Administered 2023-10-26: 30 mg via ORAL
  Filled 2023-10-26: qty 1

## 2023-10-26 MED ORDER — IOHEXOL 350 MG/ML SOLN: 75.0000 mL | Freq: Once | INTRAVENOUS | Status: DC | PRN

## 2023-10-26 MED ORDER — IOHEXOL 350 MG/ML SOLN
75.0000 mL | Freq: Once | INTRAVENOUS | Status: AC | PRN
  Administered 2023-10-26: 75 mL via INTRAVENOUS

## 2023-10-26 MED ORDER — ONDANSETRON HCL 4 MG/2ML IJ SOLN
4.0000 mg | Freq: Four times a day (QID) | INTRAMUSCULAR | Status: DC | PRN
  Administered 2023-10-26: 4 mg via INTRAVENOUS
  Filled 2023-10-26: qty 2

## 2023-10-26 MED ORDER — LIDOCAINE HCL (PF) 1 % IJ SOLN
1.0000 mL | Freq: Once | INTRAMUSCULAR | Status: AC
  Administered 2023-10-26: 2.1 mL
  Filled 2023-10-26: qty 5

## 2023-10-26 MED ORDER — BICTEGRAVIR-EMTRICITAB-TENOFOV 50-200-25 MG PO TABS
1.0000 | ORAL_TABLET | Freq: Every day | ORAL | 0 refills | Status: DC
Start: 2023-10-26 — End: 2023-11-25

## 2023-10-26 NOTE — ED Notes (Signed)
 Sane  nurse   called  per  Rosina PEAK

## 2023-10-26 NOTE — SANE Note (Signed)
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 navicularis, hymen (traction applied) Patient labia majora, labia minora, clitoral hood under ALS Patient labia majora, labia minora, clitoral hood, posterior fourchette, fossa navicularis, hymen (separation applied) Patient buttocks Patient buttocks Patient buttocks with ABFO Patient anus 100. Patient anus 101. Bookend/patient label/staff ID
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
## 2023-10-26 NOTE — ED Notes (Signed)
 Pt pregancy test is NEGATIVE per SANE

## 2023-10-26 NOTE — Consult Note (Signed)
 POCT urine preg is negative
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
## 2023-10-26 NOTE — ED Notes (Signed)
 Pt d/c and IV removed and meds and d/c paperwork given to [facility] PD officer.  Pt is requesting her left ear be checked.

## 2023-10-26 NOTE — ED Provider Notes (Addendum)
 Procedures     ----------------------------------------- 9:38 PM on 10/26/2023 ----------------------------------------- CT scans all unremarkable, no fractures or other acute findings.  Stable for discharge.  Patient does report left ear pain, external canal is normal, TM intact and normal.     Viviann Pastor, MD 10/26/23 2138    Viviann Pastor, MD 10/26/23 2210
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
## 2023-10-26 NOTE — Discharge Instructions (Addendum)
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 partner may also need treatment. Estrogen and progestin hormones may not work as well while you are taking this medication. A barrier contraceptive, such as a condom or diaphragm, is recommended if you are using these hormones for contraception. Talk to your care team about effective forms of contraception.  What side effects may I notice from receiving this  medication? Side effects that you should report to your care team as soon as possible: Allergic reactions--skin rash, itching, hives, swelling of the face, lips, tongue, or throat Dizziness, loss of balance or coordination, confusion or trouble speaking Fever, neck pain or stiffness, sensitivity to light, headache, nausea, vomiting, confusion Heart rhythm changes--fast or irregular heartbeat, dizziness, feeling faint or lightheaded, chest pain, trouble breathing Liver injury--right upper belly  pain, loss of appetite, nausea, light-colored stool, dark yellow or brown urine, yellowing skin or eyes, unusual weakness or fatigue Pain, tingling, or numbness in the hands or feet Redness, blistering, peeling, or loosening of the skin, including inside the mouth Seizures Severe diarrhea, fever Sudden eye pain or change in vision such as blurry vision, seeing halos around lights, vision loss Unusual vaginal discharge, itching, or odor Side effects that usually do not require medical attention (report these to your care team if they continue or are bothersome): Diarrhea Metallic taste in mouth Nausea Stomach pain  This list may not describe all possible side effects. Call your doctor for medical advice about side effects. You may report side effects to FDA at 1-800-FDA-1088.  Where should I keep my medication? Keep out of the reach of children and pets. Store between 15 and 25 degrees C (59 and 77 degrees F). Protect from light. Get rid of any unused medication after the expiration date. To get rid of medications that are no longer needed or have expired: Take the medication to a medication take-back program. Check with your pharmacy or law enforcement to find a location. If you cannot return the medication, check the label or package insert to see if the medication should be thrown out in the garbage or flushed down the toilet. If you are not sure, ask your care team. If it is safe to put it in the trash, take the medication out of the container. Mix the medication with cat litter, dirt, coffee grounds, or other unwanted substance. Seal the mixture in a bag or container. Put it in the trash.  NOTE: This sheet is a summary. It may not cover all possible information. If you have questions about this medicine, talk to your doctor, pharmacist, or health care provider.  2024 Elsevier/Gold Standard (2022-02-23 00:00:00) Ulipristal Tablets  What is this medication? ULIPRISTAL (UE li pris tal)  can prevent pregnancy. It should be taken as soon as possible in the 5 days (120 hours) after unprotected sex or if you think your contraceptive didn't work. It belongs to a group of medications called emergency contraceptives. It does not prevent HIV or other sexually transmitted infections (STIs). This medicine may be used for other purposes; ask your health care provider or pharmacist if you have questions. COMMON BRAND NAME(S): ella   What should I tell my care team before I take this medication? They need to know if you have any of these conditions: Liver disease An unusual or allergic reaction to ulipristal, other medications, foods, dyes, or preservatives Pregnant or trying to get pregnant Breastfeeding  How should I use this medication? Take this medication by mouth with or without food. Your care team may want you to use a quick-response pregnancy test prior to using the tablets. Take your medication as soon as possible and not more than 5 days (120 hours) after the event. This medication can be taken at any time during your menstrual cycle. Follow the dose instructions of your care team exactly. Contact your care team right away if you vomit within 3 hours  of taking your medication to discuss if you need to take another tablet. A patient package insert for the product will be given with each prescription and refill. Be sure to read this information carefully each time. The sheet may change often. Contact your care team about the use of this medication in children. Special care may be needed. Overdosage: If you think you have taken too much of this medicine contact a poison control center or emergency room at once. NOTE: This medicine is only for you. Do not share this medicine with others.  What if I miss a dose? This medication is not for regular use. If you vomit within 3 hours of taking your dose, contact your care team for instructions.  What may interact with this  medication? This medication may interact with the following: Barbiturates, such as phenobarbital or primidone Bosentan Carbamazepine Certain antivirals for HIV or hepatitis Certain medications for fungal infections, such as griseofulvin, itraconazole, ketoconazole Dabigatran Digoxin Estrogen or progestin hormones Felbamate Fexofenadine Oxcarbazepine Phenytoin Rifampin St. John's wort Topiramate This list may not describe all possible interactions. Give your health care provider a list of all the medicines, herbs, non-prescription drugs, or dietary supplements you use. Also tell them if you smoke, drink alcohol, or use illegal drugs. Some items may interact with your medicine.  What should I watch for while using this medication? Your period may begin a few days earlier or later than expected. If your period is more than 7 days late, pregnancy is possible. See your care team as soon as you can and get a pregnancy test. Talk to your care team before taking this medication if you know or suspect that you are pregnant. Contact your care team if you think you may be pregnant and have taken this medication. If you have severe abdominal pain about 3 to 5 weeks after taking this medication you may have a pregnancy outside the womb, which is called an ectopic or tubal pregnancy. Call your care team or go to the nearest emergency room right away if you think this is happening. Talk to your care team about reliable forms of contraception. Emergency contraception is not to be used routinely to prevent pregnancy. It should not be used more than once in the same menstrual cycle. Estrogen and progestin hormones may not work as well while you are taking this medication. Wait at least 5 days after taking this medication to start or continue estrogen or progestin contraceptive medications. Also, a barrier contraceptive, such as a condom or diaphragm, is recommended between the time you take this medication and  until your next menstrual period.  What side effects may I notice from receiving this medication? Side effects that you should report to your care team as soon as possible: Allergic reactions--skin rash, itching, hives, swelling of the face, lips, tongue, or throat Side effects that usually do not require medical attention (report to your care team if they continue or are bothersome): Dizziness Fatigue Headache Irregular menstrual cycles or spotting Menstrual cramps Nausea Stomach pain  This list may not describe all possible side effects. Call your doctor for medical advice about side effects. You may report side effects to FDA at 1-800-FDA-1088.  Where should I keep my medication? Keep out of the reach of children and pets. Store at room temperature between 20 and 25 degrees C (68 and 77 degrees F). Protect from light. Keep in the blister card inside the original box until you are ready to take it. Get  rid of any unused medication after the expiration date. To get rid of medications that are no longer needed or have expired: Take the medication to a medication take-back program. Check with your pharmacy or law enforcement to find a location. If you cannot return the medication, ask your pharmacist or care team how to get rid of this medication safely.  NOTE: This sheet is a summary. It may not cover all possible information. If you have questions about this medicine, talk to your doctor, pharmacist, or health care provider.  2024 Elsevier/Gold Standard (2021-08-20 00:00:00) Ceftriaxone  Injection  What is this medication? CEFTRIAXONE  (sef try AX one) treats infections caused by bacteria. It belongs to a group of medications called cephalosporin antibiotics. It will not treat colds, the flu, or infections caused by viruses. This medicine may be used for other purposes; ask your health care provider or pharmacist if you have questions. COMMON BRAND NAME(S): Ceftri-IM, Ceftrisol Plus,  Rocephin   What should I tell my care team before I take this medication? They need to know if you have any of these conditions: Bleeding disorder High bilirubin level in newborn patients Kidney disease Liver disease Poor nutrition An unusual or allergic reaction to ceftriaxone , other penicillin or cephalosporin antibiotics, other medications, foods, dyes, or preservatives Pregnant or trying to get pregnant Breast-feeding  How should I use this medication? This medication is injected into a vein or a muscle. It is usually given by your care team in a hospital or clinic setting. It may also be given at home. If you get this medication at home, you will be taught how to prepare and give it. Use exactly as directed. Take it as directed on the prescription label at the same time every day. Keep taking it even if you think you are better. It is important that you put your used needles and syringes in a special sharps container. Do not put them in a trash can. If you do not have a sharps container, call your pharmacist or care team to get one. Talk to your care team about the use of this medication in children. While it may be prescribed for children as young as newborns for selected conditions, precautions do apply. Overdosage: If you think you have taken too much of this medicine contact a poison control center or emergency room at once. NOTE: This medicine is only for you. Do not share this medicine with others.  What if I miss a dose? If you get this medication at the hospital or clinic: It is important not to miss your dose. Call your care team if you are unable to keep an appointment. If you give yourself this medication at home: If you miss a dose, take it as soon as you can. Then continue your normal schedule. If it is almost time for your next dose, take only that dose. Do not take double or extra doses. Call your care team with questions. What may interact with this medication? Estrogen  or progestin hormones Intravenous calcium This list may not describe all possible interactions. Give your health care provider a list of all the medicines, herbs, non-prescription drugs, or dietary supplements you use. Also tell them if you smoke, drink alcohol, or use illegal drugs. Some items may interact with your medicine.  What should I watch for while using this medication? Tell your care team if your symptoms do not start to get better or if they get worse. Do not treat diarrhea with over the counter products.  Contact your care team if you have diarrhea that lasts more than 2 days or if it is severe and watery. If you have diabetes, you may get a false-positive result for sugar in your urine. Check with your care team. If you are being treated for a sexually transmitted infection (STI), avoid sexual contact until you have finished your treatment. Your partner may also need treatment.  What side effects may I notice from receiving this medication? Side effects that you should report to your care team as soon as possible: Allergic reactions--skin rash, itching, hives, swelling of the face, lips, tongue, or throat Hemolytic anemia--unusual weakness or fatigue, dizziness, headache, trouble breathing, dark urine, yellowing skin or eyes Severe diarrhea, fever Unusual vaginal discharge, itching, or odor Side effects that usually do not require medical attention (report to your care team if they continue or are bothersome): Diarrhea Headache Nausea Pain, redness, or irritation at injection site  This list may not describe all possible side effects. Call your doctor for medical advice about side effects. You may report side effects to FDA at 1-800-FDA-1088.  Where should I keep my medication? Keep out of the reach of children and pets. You will be instructed on how to store this medication. Get rid of any unused medication after the expiration date. To get rid of medications that are no  longer needed or have expired: Take the medication to a medication take-back program. Check with your pharmacy or law enforcement to find a location. If you cannot return the medication, ask your pharmacist or care team how to get rid of this medication safely.  NOTE: This sheet is a summary. It may not cover all possible information. If you have questions about this medicine, talk to your doctor, pharmacist, or health care provider.  2024 Elsevier/Gold Standard (2021-04-13 00:00:00) Bictegravir; Emtricitabine ; Tenofovir  Alafenamide Tablets  What is this medication? BICTEGRAVIR; EMTRICITABINE ; TENOFOVIR  ALAFENAMIDE (bik TEG ra veer; em tri SIT uh bean; ten OF oh vir AL a FEN a mide) helps manage the symptoms of HIV infection. It works by limiting the spread of HIV in the body. It is a combination of three antiretroviral medications. This medication is not a cure for HIV or AIDS and it may still be possible to spread HIV to others while taking it. It does not prevent other sexually transmitted infections (STIs). This medicine may be used for other purposes; ask your health care provider or pharmacist if you have questions. COMMON BRAND NAME(S): Biktarvy  What should I tell my care team before I take this medication? They need to know if you have any of these conditions: Kidney disease Liver disease An unusual or allergic reaction to bictegravir, emtricitabine , tenofovir , other medications, foods, dyes, or preservatives Pregnant or trying to get pregnant Breast-feeding  How should I use this medication? Take this medication by mouth with a glass of water. You can take it with or without food. If it upsets your stomach, take it with food. You may cut the tablet in half. This may help you swallow the tablet if the whole tablet is too big. Be sure to take both halves within 10 minutes. Do not take just one-half of the tablet. For your therapy to work as well as possible, take each dose exactly as  prescribed on the prescription label. Do not skip doses. Skipping doses can make HIV resistant to this and other medications. Keep taking this therapy unless your care team tells you to stop. Take antacids with aluminum or magnesium   in them at a different time of day than this medication. Take this medication 2 hours BEFORE or 6 hours AFTER these products. Take products with calcium or iron in them at the same time you take this medication with food. Talk to your care team about the use of this medication in children. While it may be prescribed for children for selected conditions, precautions do apply. Overdosage: If you think you have taken too much of this medicine contact a poison control center or emergency room at once. NOTE: This medicine is only for you. Do not share this medicine with others.  What if I miss a dose? If you miss a dose, take it as soon as you can. If it is almost time for your next dose, take only that dose. Do not take double or extra doses. What may interact with this medication? Do not take this medication with any of the following: Adefovir Any medication that contains lamivudine Dofetilide Rifampin This medication may also interact with the following: Antacids Certain antibiotics like rifabutin, rifapentine, aminoglycosides Certain medications for seizures like carbamazepine, oxcarbazepine, phenobarbital, phenytoin Medications for viral infection like cidofovir, acyclovir, valacyclovir, ganciclovir, valganciclovir Metformin Non-steroidal antiinflammatory drugs (NSAIDs) St. John's Wort Sucralfate Supplements containing calcium or iron  This list may not describe all possible interactions. Give your health care provider a list of all the medicines, herbs, non-prescription drugs, or dietary supplements you use. Also tell them if you smoke, drink alcohol, or use illegal drugs. Some items may interact with your medicine. What should I watch for while using this  medication? Visit your care team for regular checks on your progress. Tell your care team if your symptoms do not start to get better or if they get worse. You may need blood work done while you are taking this medication. HIV is spread to others through sexual or blood contact. Talk to your care team about how to stop the spread of HIV. If you have hepatitis B, talk to your care team if you plan to stop this medication. The symptoms of hepatitis B may get worse if you stop this medication.  What side effects may I notice from receiving this medication? Side effects that you should report to your care team as soon as possible: Allergic reactions--skin rash, itching, hives, swelling of the face, lips, tongue, or throat High lactic acid level--muscle pain or cramps, stomach pain, trouble breathing, general discomfort and fatigue Infection--fever, chills, cough, or sore throat Kidney injury--decrease in the amount of urine, swelling of the ankles, hands, or feet Liver injury--right upper belly pain, loss of appetite, nausea, light-colored stool, dark yellow or brown urine, yellowing skin or eyes, unusual weakness or fatigue Side effects that usually do not require medical attention (report these to your care team if they continue or are bothersome): Diarrhea Headache Nausea This list may not describe all possible side effects. Call your doctor for medical advice about side effects. You may report side effects to FDA at 1-800-FDA-1088.  Where should I keep my medication? Keep out of the reach of children and pets. Bottles: Store below 30 degrees C (86 degrees F). Keep the container tightly closed. Keep this medication in the original container until you are ready to take it. Get rid of any unused medication after the expiration date. Blister Pack: Store at room temperature between 20 and 25 degrees C (68 and 77 degrees F). Keep this medication in the original packaging until you are ready to take  it. Get rid  of any unused medication after the expiration date. To get rid of medications that are no longer needed or have expired: Take the medication to a medication take-back program. Check with your pharmacy or law enforcement to find a location. If you cannot return the medication, check the label or package insert to see if the medication should be thrown out in the garbage or flushed down the toilet. If you are not sure, ask your care team. If it is safe to put it in the trash, take the medication out of the container. Mix the medication with cat litter, dirt, coffee grounds, or other unwanted substance. Seal the mixture in a bag or container. Put it in the trash.  NOTE: This sheet is a summary. It may not cover all possible information. If you have questions about this medicine, talk to your doctor, pharmacist, or health care provider.  2024 Elsevier/Gold Standard (2020-11-03 00:00:00)
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
## 2023-10-27 ENCOUNTER — Observation Stay
Admission: EM | Admit: 2023-10-27 | Discharge: 2023-10-28 | Disposition: A | Discharge status: Home / Self Care | Payer: MEDICAID | Attending: Obstetrics and Gynecology | Admitting: Obstetrics and Gynecology

## 2023-10-27 ENCOUNTER — Other Ambulatory Visit (HOSPITAL_COMMUNITY): Payer: Self-pay

## 2023-10-27 ENCOUNTER — Emergency Department: Payer: MEDICAID

## 2023-10-27 ENCOUNTER — Other Ambulatory Visit: Payer: Self-pay

## 2023-10-27 ENCOUNTER — Telehealth (HOSPITAL_COMMUNITY): Payer: Self-pay | Admitting: Pharmacy Technician

## 2023-10-27 DIAGNOSIS — N12 Tubulo-interstitial nephritis, not specified as acute or chronic: Principal | ICD-10-CM | POA: Diagnosis present

## 2023-10-27 DIAGNOSIS — R1084 Generalized abdominal pain: Secondary | ICD-10-CM

## 2023-10-27 DIAGNOSIS — F319 Bipolar disorder, unspecified: Secondary | ICD-10-CM | POA: Diagnosis present

## 2023-10-27 DIAGNOSIS — F332 Major depressive disorder, recurrent severe without psychotic features: Secondary | ICD-10-CM | POA: Diagnosis present

## 2023-10-27 DIAGNOSIS — F1920 Other psychoactive substance dependence, uncomplicated: Secondary | ICD-10-CM | POA: Diagnosis present

## 2023-10-27 DIAGNOSIS — F1111 Opioid abuse, in remission: Secondary | ICD-10-CM | POA: Diagnosis present

## 2023-10-27 LAB — COMPREHENSIVE METABOLIC PANEL WITH GFR
ALT: 14 U/L (ref 0–44)
AST: 20 U/L (ref 15–41)
Albumin: 3.8 g/dL (ref 3.5–5.0)
Alkaline Phosphatase: 37 U/L — ABNORMAL LOW (ref 38–126)
Anion gap: 11 (ref 5–15)
BUN: 16 mg/dL (ref 6–20)
CO2: 25 mmol/L (ref 22–32)
Calcium: 9.1 mg/dL (ref 8.9–10.3)
Chloride: 102 mmol/L (ref 98–111)
Creatinine, Ser: 0.92 mg/dL (ref 0.44–1.00)
GFR, Estimated: >60 mL/min (ref >60)
Glucose, Bld: 93 mg/dL (ref 70–99)
Potassium: 3.8 mmol/L (ref 3.5–5.1)
Sodium: 138 mmol/L (ref 135–145)
Total Bilirubin: 1 mg/dL (ref 0.0–1.2)
Total Protein: 6.9 g/dL (ref 6.5–8.1)

## 2023-10-27 LAB — URINALYSIS, ROUTINE W REFLEX MICROSCOPIC
Bilirubin Urine: NEGATIVE
Glucose, UA: NEGATIVE mg/dL
Hgb urine dipstick: NEGATIVE
Ketones, ur: NEGATIVE mg/dL
Nitrite: NEGATIVE
Protein, ur: NEGATIVE mg/dL
Specific Gravity, Urine: >1.046 — ABNORMAL HIGH (ref 1.005–1.030)
pH: 7 (ref 5.0–8.0)

## 2023-10-27 LAB — CBC
HCT: 35.6 % — ABNORMAL LOW (ref 36.0–46.0)
Hemoglobin: 12 g/dL (ref 12.0–15.0)
MCH: 30.5 pg (ref 26.0–34.0)
MCHC: 33.7 g/dL (ref 30.0–36.0)
MCV: 90.4 fL (ref 80.0–100.0)
Platelets: 243 K/uL (ref 150–400)
RBC: 3.94 MIL/uL (ref 3.87–5.11)
RDW: 12.4 % (ref 11.5–15.5)
WBC: 6.2 K/uL (ref 4.0–10.5)
nRBC: 0 % (ref 0.0–0.2)

## 2023-10-27 LAB — CBC WITH DIFFERENTIAL/PLATELET
Abs Immature Granulocytes: 0.04 K/uL (ref 0.00–0.07)
Basophils Absolute: 0 K/uL (ref 0.0–0.1)
Basophils Relative: 0 %
Eosinophils Absolute: 0.1 K/uL (ref 0.0–0.5)
Eosinophils Relative: 1 %
HCT: 39.3 % (ref 36.0–46.0)
Hemoglobin: 13.1 g/dL (ref 12.0–15.0)
Immature Granulocytes: 0 %
Lymphocytes Relative: 36 %
Lymphs Abs: 3.6 K/uL (ref 0.7–4.0)
MCH: 30 pg (ref 26.0–34.0)
MCHC: 33.3 g/dL (ref 30.0–36.0)
MCV: 90.1 fL (ref 80.0–100.0)
Monocytes Absolute: 1.1 K/uL — ABNORMAL HIGH (ref 0.1–1.0)
Monocytes Relative: 11 %
Neutro Abs: 5.1 K/uL (ref 1.7–7.7)
Neutrophils Relative %: 52 %
Platelets: 293 K/uL (ref 150–400)
RBC: 4.36 MIL/uL (ref 3.87–5.11)
RDW: 12.3 % (ref 11.5–15.5)
WBC: 10 K/uL (ref 4.0–10.5)
nRBC: 0 % (ref 0.0–0.2)

## 2023-10-27 LAB — LIPASE, BLOOD: Lipase: 33 U/L (ref 11–51)

## 2023-10-27 LAB — POC URINE PREG, ED
Preg Test, Ur: NEGATIVE
Preg Test, Ur: NEGATIVE

## 2023-10-27 LAB — CREATININE, SERUM
Creatinine, Ser: 0.83 mg/dL (ref 0.44–1.00)
GFR, Estimated: >60 mL/min (ref >60)

## 2023-10-27 LAB — HCG, QUANTITATIVE, PREGNANCY: hCG, Beta Chain, Quant, S: <1 m[IU]/mL (ref <5)

## 2023-10-27 LAB — LACTIC ACID, PLASMA: Lactic Acid, Venous: 0.7 mmol/L (ref 0.5–1.9)

## 2023-10-27 LAB — RPR: RPR Ser Ql: NONREACTIVE

## 2023-10-27 MED ORDER — POLYETHYLENE GLYCOL 3350 17 G PO PACK: 17.0000 g | PACK | Freq: Every day | ORAL | Status: DC | PRN

## 2023-10-27 MED ORDER — OXYCODONE HCL 5 MG PO TABS: 5.0000 mg | ORAL_TABLET | ORAL | Status: DC | PRN

## 2023-10-27 MED ORDER — IOHEXOL 300 MG/ML  SOLN
100.0000 mL | Freq: Once | INTRAMUSCULAR | Status: AC | PRN
  Administered 2023-10-27: 100 mL via INTRAVENOUS

## 2023-10-27 MED ORDER — LACTATED RINGERS IV BOLUS
1000.0000 mL | Freq: Once | INTRAVENOUS | Status: AC
  Administered 2023-10-27: 1000 mL via INTRAVENOUS

## 2023-10-27 MED ORDER — ENOXAPARIN SODIUM 40 MG/0.4ML IJ SOSY: 40.0000 mg | PREFILLED_SYRINGE | INTRAMUSCULAR | Status: DC

## 2023-10-27 MED ORDER — ONDANSETRON HCL 4 MG/2ML IJ SOLN: 4.0000 mg | Freq: Four times a day (QID) | INTRAMUSCULAR | Status: DC | PRN

## 2023-10-27 MED ORDER — SODIUM CHLORIDE 0.9 % IV SOLN: 2.0000 g | Freq: Once | INTRAVENOUS | Status: DC

## 2023-10-27 MED ORDER — SODIUM CHLORIDE 0.9 % IV BOLUS (SEPSIS)
1000.0000 mL | Freq: Once | INTRAVENOUS | Status: AC
  Administered 2023-10-27: 1000 mL via INTRAVENOUS

## 2023-10-27 MED ORDER — KETOROLAC TROMETHAMINE 30 MG/ML IJ SOLN
30.0000 mg | Freq: Once | INTRAMUSCULAR | Status: AC
  Administered 2023-10-27: 30 mg via INTRAVENOUS
  Filled 2023-10-27: qty 1

## 2023-10-27 MED ORDER — ONDANSETRON HCL 4 MG/2ML IJ SOLN
4.0000 mg | Freq: Once | INTRAMUSCULAR | Status: AC
  Administered 2023-10-27: 4 mg via INTRAVENOUS
  Filled 2023-10-27: qty 2

## 2023-10-27 MED ORDER — ACETAMINOPHEN 325 MG PO TABS: 650.0000 mg | ORAL_TABLET | Freq: Four times a day (QID) | ORAL | Status: DC | PRN

## 2023-10-27 MED ORDER — BICTEGRAVIR-EMTRICITAB-TENOFOV 50-200-25 MG PO TABS
1.0000 | ORAL_TABLET | Freq: Every day | ORAL | Status: DC
  Administered 2023-10-27 – 2023-10-28 (×2): 1 via ORAL
  Filled 2023-10-27 (×2): qty 1

## 2023-10-27 MED ORDER — MORPHINE SULFATE (PF) 2 MG/ML IV SOLN: 2.0000 mg | INTRAVENOUS | Status: DC | PRN

## 2023-10-27 MED ORDER — DOXYCYCLINE HYCLATE 100 MG PO TABS
100.0000 mg | ORAL_TABLET | Freq: Two times a day (BID) | ORAL | Status: DC
  Administered 2023-10-27 – 2023-10-28 (×2): 100 mg via ORAL
  Filled 2023-10-27 (×2): qty 1

## 2023-10-27 MED ORDER — SODIUM CHLORIDE 0.9 % IV SOLN
1.0000 g | INTRAVENOUS | Status: DC
Start: 2023-10-28 — End: 2023-10-28
  Administered 2023-10-28: 1 g via INTRAVENOUS
  Filled 2023-10-27: qty 10

## 2023-10-27 MED ORDER — ENOXAPARIN SODIUM 40 MG/0.4ML IJ SOSY
40.0000 mg | PREFILLED_SYRINGE | INTRAMUSCULAR | Status: DC
  Administered 2023-10-27 – 2023-10-28 (×2): 40 mg via SUBCUTANEOUS
  Filled 2023-10-27 (×2): qty 0.4

## 2023-10-27 MED ORDER — HYDROCODONE-ACETAMINOPHEN 5-325 MG PO TABS: 1.0000 | ORAL_TABLET | ORAL | Status: DC | PRN

## 2023-10-27 MED ORDER — ACETAMINOPHEN 650 MG RE SUPP: 650.0000 mg | Freq: Four times a day (QID) | RECTAL | Status: DC | PRN

## 2023-10-27 MED ORDER — KETOROLAC TROMETHAMINE 30 MG/ML IJ SOLN
30.0000 mg | Freq: Four times a day (QID) | INTRAMUSCULAR | Status: DC | PRN
  Administered 2023-10-27 – 2023-10-28 (×3): 30 mg via INTRAVENOUS
  Filled 2023-10-27 (×3): qty 1

## 2023-10-27 MED ORDER — ONDANSETRON HCL 4 MG PO TABS: 4.0000 mg | ORAL_TABLET | Freq: Four times a day (QID) | ORAL | Status: DC | PRN

## 2023-10-27 NOTE — Telephone Encounter (Signed)
 Patient Product/process development scientist completed.    The patient is insured through [employer] [place].     Ran test claim for Biktarvy  and the current 30 day co-pay is $0.00.   This test claim was processed through [facility]- copay amounts may vary at other pharmacies due to pharmacy/plan contracts, or as the patient moves through the different stages of their insurance plan.     Reyes Sharps, CPHT Pharmacy Technician III Certified Patient Advocate [facility] Patient Advocate Team Direct Number: [phone]  Fax: [phone]

## 2023-10-27 NOTE — Consult Note (Signed)
 Secure message sent to hospitalist and ED staff about positive STI results. Patient will need follow for this.
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
## 2023-10-27 NOTE — ED Notes (Signed)
 Pt emptied bladder for CT scan... CT called at this time. Pt moving to room 17 p radiology.
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
## 2023-10-28 ENCOUNTER — Other Ambulatory Visit: Payer: Self-pay

## 2023-10-28 DIAGNOSIS — Z9189 Other specified personal risk factors, not elsewhere classified: Secondary | ICD-10-CM | POA: Diagnosis not present

## 2023-10-28 DIAGNOSIS — A749 Chlamydial infection, unspecified: Secondary | ICD-10-CM | POA: Diagnosis not present

## 2023-10-28 DIAGNOSIS — T7621XA Adult sexual abuse, suspected, initial encounter: Secondary | ICD-10-CM | POA: Diagnosis not present

## 2023-10-28 DIAGNOSIS — A549 Gonococcal infection, unspecified: Secondary | ICD-10-CM

## 2023-10-28 DIAGNOSIS — N12 Tubulo-interstitial nephritis, not specified as acute or chronic: Secondary | ICD-10-CM | POA: Diagnosis not present

## 2023-10-28 LAB — CBC
HCT: 36.8 % (ref 36.0–46.0)
Hemoglobin: 12.1 g/dL (ref 12.0–15.0)
MCH: 30 pg (ref 26.0–34.0)
MCHC: 32.9 g/dL (ref 30.0–36.0)
MCV: 91.1 fL (ref 80.0–100.0)
Platelets: 227 K/uL (ref 150–400)
RBC: 4.04 MIL/uL (ref 3.87–5.11)
RDW: 12 % (ref 11.5–15.5)
WBC: 6.8 K/uL (ref 4.0–10.5)
nRBC: 0 % (ref 0.0–0.2)

## 2023-10-28 LAB — BASIC METABOLIC PANEL WITH GFR
Anion gap: 5 (ref 5–15)
BUN: 15 mg/dL (ref 6–20)
CO2: 26 mmol/L (ref 22–32)
Calcium: 8.2 mg/dL — ABNORMAL LOW (ref 8.9–10.3)
Chloride: 108 mmol/L (ref 98–111)
Creatinine, Ser: 0.97 mg/dL (ref 0.44–1.00)
GFR, Estimated: >60 mL/min (ref >60)
Glucose, Bld: 99 mg/dL (ref 70–99)
Potassium: 4.1 mmol/L (ref 3.5–5.1)
Sodium: 139 mmol/L (ref 135–145)

## 2023-10-28 MED ORDER — METRONIDAZOLE 500 MG PO TABS
500.0000 mg | ORAL_TABLET | Freq: Two times a day (BID) | ORAL | 0 refills | Status: AC
  Filled 2023-10-28: qty 27, 14d supply, fill #0

## 2023-10-28 MED ORDER — ARIPIPRAZOLE 5 MG PO TABS: 5.0000 mg | ORAL_TABLET | Freq: Every day | ORAL | 0 refills | Status: AC

## 2023-10-28 MED ORDER — DOXYCYCLINE HYCLATE 100 MG PO TABS
100.0000 mg | ORAL_TABLET | Freq: Two times a day (BID) | ORAL | 0 refills | Status: AC
  Filled 2023-10-28: qty 26, 13d supply, fill #0

## 2023-10-28 MED ORDER — BICTEGRAVIR-EMTRICITAB-TENOFOV 50-200-25 MG PO TABS
1.0000 | ORAL_TABLET | Freq: Every day | ORAL | 0 refills | Status: AC
  Filled 2023-10-28: qty 30, 30d supply, fill #0

## 2023-10-28 MED ORDER — METRONIDAZOLE 500 MG PO TABS
500.0000 mg | ORAL_TABLET | Freq: Two times a day (BID) | ORAL | Status: DC
  Administered 2023-10-28: 500 mg via ORAL
  Filled 2023-10-28: qty 1

## 2023-10-28 NOTE — Progress Notes (Signed)
 Patient also requesting hospitalization documentation to be sent to probation officer, as well. Documentation e-mailed to Officer Kennyth, per request.
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
## 2023-10-28 NOTE — Progress Notes (Signed)
 Patient provided additional resources for [facility] and [facility].  Patient will also need transportation at d/c to the following address: [address] [place] [address]. TOC made aware.

## 2023-10-28 NOTE — Progress Notes (Signed)
 Met with patient at bedside. Patient has been released from police custody following making bail. She does have a court appearance scheduled for today. [employer] notified via phone, per patient request. Provided medical documentation via fax. Patient given copy of documentation, as well.
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
## 2023-10-29 LAB — HEPATITIS B CORE ANTIBODY, TOTAL: HEP B CORE AB: NEGATIVE

## 2023-10-29 LAB — HEPATITIS B SURFACE ANTIBODY, QUANTITATIVE: Hep B S AB Quant (Post): <3.5 m[IU]/mL — ABNORMAL LOW

## 2023-10-30 LAB — URINE CULTURE: Culture: <10000 — AB

## 2023-10-31 ENCOUNTER — Telehealth: Payer: Self-pay

## 2023-11-01 LAB — CULTURE, BLOOD (ROUTINE X 2)
Culture: NO GROWTH
Culture: NO GROWTH
Special Requests: ADEQUATE
Special Requests: ADEQUATE

## 2023-11-24 ENCOUNTER — Inpatient Hospital Stay: Payer: MEDICAID | Admitting: Infectious Diseases
